# Patient Record
Sex: Female | Born: 1945 | Race: White | Hispanic: No | Smoking: Former smoker
Health system: Southern US, Community
[De-identification: ages and names within clinical notes are randomized; demographics above are authoritative.]

## PROBLEM LIST (undated history)

## (undated) DIAGNOSIS — T8182XA Emphysema (subcutaneous) resulting from a procedure, initial encounter: Secondary | ICD-10-CM

## (undated) DIAGNOSIS — I10 Essential (primary) hypertension: Secondary | ICD-10-CM

## (undated) DIAGNOSIS — F329 Major depressive disorder, single episode, unspecified: Secondary | ICD-10-CM

## (undated) DIAGNOSIS — J449 Chronic obstructive pulmonary disease, unspecified: Secondary | ICD-10-CM

## (undated) DIAGNOSIS — J984 Other disorders of lung: Secondary | ICD-10-CM

## (undated) DIAGNOSIS — J302 Other seasonal allergic rhinitis: Secondary | ICD-10-CM

## (undated) DIAGNOSIS — K219 Gastro-esophageal reflux disease without esophagitis: Secondary | ICD-10-CM

## (undated) DIAGNOSIS — F32A Depression, unspecified: Secondary | ICD-10-CM

## (undated) DIAGNOSIS — D649 Anemia, unspecified: Secondary | ICD-10-CM

## (undated) DIAGNOSIS — J45909 Unspecified asthma, uncomplicated: Secondary | ICD-10-CM

## (undated) DIAGNOSIS — C4490 Unspecified malignant neoplasm of skin, unspecified: Secondary | ICD-10-CM

## (undated) DIAGNOSIS — I482 Chronic atrial fibrillation, unspecified: Secondary | ICD-10-CM

## (undated) HISTORY — DX: Major depressive disorder, single episode, unspecified: F32.9

## (undated) HISTORY — DX: Other seasonal allergic rhinitis: J30.2

## (undated) HISTORY — DX: Depression, unspecified: F32.A

## (undated) HISTORY — DX: Unspecified malignant neoplasm of skin, unspecified: C44.90

## (undated) HISTORY — DX: Chronic atrial fibrillation, unspecified: I48.20

## (undated) HISTORY — DX: Chronic obstructive pulmonary disease, unspecified: J44.9

## (undated) HISTORY — PX: OTHER SURGICAL HISTORY: SHX169

## (undated) HISTORY — DX: Emphysema (subcutaneous) resulting from a procedure, initial encounter: T81.82XA

## (undated) HISTORY — DX: Other disorders of lung: J98.4

## (undated) HISTORY — DX: Anemia, unspecified: D64.9

## (undated) HISTORY — DX: Unspecified asthma, uncomplicated: J45.909

## (undated) HISTORY — PX: HYSTERECTOMY ABDOMINAL WITH SALPINGECTOMY: SHX6725

## (undated) HISTORY — DX: Essential (primary) hypertension: I10

## (undated) HISTORY — DX: Gastro-esophageal reflux disease without esophagitis: K21.9

---

## 2016-01-13 DIAGNOSIS — Z79891 Long term (current) use of opiate analgesic: Secondary | ICD-10-CM | POA: Diagnosis not present

## 2016-01-13 DIAGNOSIS — G894 Chronic pain syndrome: Secondary | ICD-10-CM | POA: Diagnosis not present

## 2016-01-13 DIAGNOSIS — E782 Mixed hyperlipidemia: Secondary | ICD-10-CM | POA: Diagnosis not present

## 2016-01-13 DIAGNOSIS — I4891 Unspecified atrial fibrillation: Secondary | ICD-10-CM | POA: Diagnosis not present

## 2016-01-13 DIAGNOSIS — I10 Essential (primary) hypertension: Secondary | ICD-10-CM | POA: Diagnosis not present

## 2016-01-13 DIAGNOSIS — J449 Chronic obstructive pulmonary disease, unspecified: Secondary | ICD-10-CM | POA: Diagnosis not present

## 2016-01-27 DIAGNOSIS — I4891 Unspecified atrial fibrillation: Secondary | ICD-10-CM | POA: Diagnosis not present

## 2016-01-27 DIAGNOSIS — J449 Chronic obstructive pulmonary disease, unspecified: Secondary | ICD-10-CM | POA: Diagnosis not present

## 2016-01-27 DIAGNOSIS — J984 Other disorders of lung: Secondary | ICD-10-CM | POA: Diagnosis not present

## 2016-01-27 DIAGNOSIS — Z87891 Personal history of nicotine dependence: Secondary | ICD-10-CM | POA: Diagnosis not present

## 2016-01-31 DIAGNOSIS — I10 Essential (primary) hypertension: Secondary | ICD-10-CM | POA: Diagnosis not present

## 2016-02-06 DIAGNOSIS — J449 Chronic obstructive pulmonary disease, unspecified: Secondary | ICD-10-CM | POA: Diagnosis not present

## 2016-02-06 DIAGNOSIS — R739 Hyperglycemia, unspecified: Secondary | ICD-10-CM | POA: Diagnosis not present

## 2016-02-06 DIAGNOSIS — E782 Mixed hyperlipidemia: Secondary | ICD-10-CM | POA: Diagnosis not present

## 2016-02-06 DIAGNOSIS — I10 Essential (primary) hypertension: Secondary | ICD-10-CM | POA: Diagnosis not present

## 2016-02-06 DIAGNOSIS — I4891 Unspecified atrial fibrillation: Secondary | ICD-10-CM | POA: Diagnosis not present

## 2016-02-06 DIAGNOSIS — R413 Other amnesia: Secondary | ICD-10-CM | POA: Diagnosis not present

## 2016-02-07 DIAGNOSIS — I4891 Unspecified atrial fibrillation: Secondary | ICD-10-CM | POA: Diagnosis not present

## 2016-02-21 DIAGNOSIS — I4891 Unspecified atrial fibrillation: Secondary | ICD-10-CM | POA: Diagnosis not present

## 2016-03-06 DIAGNOSIS — I4891 Unspecified atrial fibrillation: Secondary | ICD-10-CM | POA: Diagnosis not present

## 2016-03-19 DIAGNOSIS — I10 Essential (primary) hypertension: Secondary | ICD-10-CM | POA: Diagnosis not present

## 2016-03-19 DIAGNOSIS — G894 Chronic pain syndrome: Secondary | ICD-10-CM | POA: Diagnosis not present

## 2016-03-28 DIAGNOSIS — R109 Unspecified abdominal pain: Secondary | ICD-10-CM | POA: Diagnosis not present

## 2016-03-28 DIAGNOSIS — G894 Chronic pain syndrome: Secondary | ICD-10-CM | POA: Diagnosis not present

## 2016-03-30 DIAGNOSIS — I4891 Unspecified atrial fibrillation: Secondary | ICD-10-CM | POA: Diagnosis not present

## 2016-04-06 DIAGNOSIS — R4189 Other symptoms and signs involving cognitive functions and awareness: Secondary | ICD-10-CM | POA: Diagnosis not present

## 2016-04-13 DIAGNOSIS — I4891 Unspecified atrial fibrillation: Secondary | ICD-10-CM | POA: Diagnosis not present

## 2016-04-24 DIAGNOSIS — I4891 Unspecified atrial fibrillation: Secondary | ICD-10-CM | POA: Diagnosis not present

## 2016-04-28 DIAGNOSIS — K921 Melena: Secondary | ICD-10-CM | POA: Diagnosis not present

## 2016-05-01 DIAGNOSIS — R197 Diarrhea, unspecified: Secondary | ICD-10-CM | POA: Diagnosis not present

## 2016-05-02 DIAGNOSIS — K921 Melena: Secondary | ICD-10-CM | POA: Diagnosis not present

## 2016-05-07 DIAGNOSIS — E669 Obesity, unspecified: Secondary | ICD-10-CM | POA: Diagnosis not present

## 2016-05-07 DIAGNOSIS — Z6838 Body mass index (BMI) 38.0-38.9, adult: Secondary | ICD-10-CM | POA: Diagnosis not present

## 2016-05-07 DIAGNOSIS — G4733 Obstructive sleep apnea (adult) (pediatric): Secondary | ICD-10-CM | POA: Diagnosis not present

## 2016-05-07 DIAGNOSIS — J449 Chronic obstructive pulmonary disease, unspecified: Secondary | ICD-10-CM | POA: Diagnosis not present

## 2016-05-08 DIAGNOSIS — I4891 Unspecified atrial fibrillation: Secondary | ICD-10-CM | POA: Diagnosis not present

## 2016-05-21 DIAGNOSIS — G894 Chronic pain syndrome: Secondary | ICD-10-CM | POA: Diagnosis not present

## 2016-05-22 DIAGNOSIS — I4891 Unspecified atrial fibrillation: Secondary | ICD-10-CM | POA: Diagnosis not present

## 2016-06-05 DIAGNOSIS — I4891 Unspecified atrial fibrillation: Secondary | ICD-10-CM | POA: Diagnosis not present

## 2016-06-20 DIAGNOSIS — I10 Essential (primary) hypertension: Secondary | ICD-10-CM | POA: Diagnosis not present

## 2016-06-20 DIAGNOSIS — E782 Mixed hyperlipidemia: Secondary | ICD-10-CM | POA: Diagnosis not present

## 2016-06-20 DIAGNOSIS — M159 Polyosteoarthritis, unspecified: Secondary | ICD-10-CM | POA: Diagnosis not present

## 2016-06-20 DIAGNOSIS — I5032 Chronic diastolic (congestive) heart failure: Secondary | ICD-10-CM | POA: Diagnosis not present

## 2016-06-20 DIAGNOSIS — I4891 Unspecified atrial fibrillation: Secondary | ICD-10-CM | POA: Diagnosis not present

## 2016-06-20 DIAGNOSIS — F331 Major depressive disorder, recurrent, moderate: Secondary | ICD-10-CM | POA: Diagnosis not present

## 2016-06-20 DIAGNOSIS — J449 Chronic obstructive pulmonary disease, unspecified: Secondary | ICD-10-CM | POA: Diagnosis not present

## 2016-06-20 DIAGNOSIS — G4733 Obstructive sleep apnea (adult) (pediatric): Secondary | ICD-10-CM | POA: Diagnosis not present

## 2016-06-26 DIAGNOSIS — I4891 Unspecified atrial fibrillation: Secondary | ICD-10-CM | POA: Diagnosis not present

## 2016-07-24 DIAGNOSIS — I4891 Unspecified atrial fibrillation: Secondary | ICD-10-CM | POA: Diagnosis not present

## 2016-08-03 DIAGNOSIS — I4891 Unspecified atrial fibrillation: Secondary | ICD-10-CM | POA: Diagnosis not present

## 2016-08-17 DIAGNOSIS — I4891 Unspecified atrial fibrillation: Secondary | ICD-10-CM | POA: Diagnosis not present

## 2016-08-28 DIAGNOSIS — I4891 Unspecified atrial fibrillation: Secondary | ICD-10-CM | POA: Diagnosis not present

## 2016-09-11 DIAGNOSIS — I4891 Unspecified atrial fibrillation: Secondary | ICD-10-CM | POA: Diagnosis not present

## 2016-09-20 DIAGNOSIS — E782 Mixed hyperlipidemia: Secondary | ICD-10-CM | POA: Diagnosis not present

## 2016-09-20 DIAGNOSIS — I5032 Chronic diastolic (congestive) heart failure: Secondary | ICD-10-CM | POA: Diagnosis not present

## 2016-09-20 DIAGNOSIS — J449 Chronic obstructive pulmonary disease, unspecified: Secondary | ICD-10-CM | POA: Diagnosis not present

## 2016-09-20 DIAGNOSIS — I10 Essential (primary) hypertension: Secondary | ICD-10-CM | POA: Diagnosis not present

## 2016-10-02 DIAGNOSIS — I4891 Unspecified atrial fibrillation: Secondary | ICD-10-CM | POA: Diagnosis not present

## 2016-10-02 DIAGNOSIS — Z7901 Long term (current) use of anticoagulants: Secondary | ICD-10-CM | POA: Diagnosis not present

## 2016-10-10 DIAGNOSIS — J441 Chronic obstructive pulmonary disease with (acute) exacerbation: Secondary | ICD-10-CM | POA: Diagnosis not present

## 2016-11-02 DIAGNOSIS — I4891 Unspecified atrial fibrillation: Secondary | ICD-10-CM | POA: Diagnosis not present

## 2016-11-02 DIAGNOSIS — Z7901 Long term (current) use of anticoagulants: Secondary | ICD-10-CM | POA: Diagnosis not present

## 2016-11-09 DIAGNOSIS — Z7901 Long term (current) use of anticoagulants: Secondary | ICD-10-CM | POA: Diagnosis not present

## 2016-11-09 DIAGNOSIS — I4891 Unspecified atrial fibrillation: Secondary | ICD-10-CM | POA: Diagnosis not present

## 2016-11-09 DIAGNOSIS — J441 Chronic obstructive pulmonary disease with (acute) exacerbation: Secondary | ICD-10-CM | POA: Diagnosis not present

## 2016-11-09 DIAGNOSIS — I5032 Chronic diastolic (congestive) heart failure: Secondary | ICD-10-CM | POA: Diagnosis not present

## 2016-11-13 DIAGNOSIS — J441 Chronic obstructive pulmonary disease with (acute) exacerbation: Secondary | ICD-10-CM | POA: Diagnosis not present

## 2016-11-26 DIAGNOSIS — J441 Chronic obstructive pulmonary disease with (acute) exacerbation: Secondary | ICD-10-CM | POA: Diagnosis not present

## 2016-11-27 DIAGNOSIS — Z7901 Long term (current) use of anticoagulants: Secondary | ICD-10-CM | POA: Diagnosis not present

## 2016-11-27 DIAGNOSIS — I4891 Unspecified atrial fibrillation: Secondary | ICD-10-CM | POA: Diagnosis not present

## 2016-12-04 DIAGNOSIS — Z23 Encounter for immunization: Secondary | ICD-10-CM | POA: Diagnosis not present

## 2016-12-04 DIAGNOSIS — G4733 Obstructive sleep apnea (adult) (pediatric): Secondary | ICD-10-CM | POA: Diagnosis not present

## 2016-12-04 DIAGNOSIS — J449 Chronic obstructive pulmonary disease, unspecified: Secondary | ICD-10-CM | POA: Diagnosis not present

## 2016-12-04 DIAGNOSIS — I4891 Unspecified atrial fibrillation: Secondary | ICD-10-CM | POA: Diagnosis not present

## 2016-12-04 DIAGNOSIS — I5032 Chronic diastolic (congestive) heart failure: Secondary | ICD-10-CM | POA: Diagnosis not present

## 2016-12-07 DIAGNOSIS — I4891 Unspecified atrial fibrillation: Secondary | ICD-10-CM | POA: Diagnosis not present

## 2016-12-21 DIAGNOSIS — I4891 Unspecified atrial fibrillation: Secondary | ICD-10-CM | POA: Diagnosis not present

## 2017-01-09 DIAGNOSIS — I4891 Unspecified atrial fibrillation: Secondary | ICD-10-CM | POA: Diagnosis not present

## 2017-01-09 DIAGNOSIS — Z1231 Encounter for screening mammogram for malignant neoplasm of breast: Secondary | ICD-10-CM | POA: Diagnosis not present

## 2017-01-23 DIAGNOSIS — I4891 Unspecified atrial fibrillation: Secondary | ICD-10-CM | POA: Diagnosis not present

## 2017-01-30 DIAGNOSIS — J449 Chronic obstructive pulmonary disease, unspecified: Secondary | ICD-10-CM | POA: Diagnosis not present

## 2017-01-30 DIAGNOSIS — I10 Essential (primary) hypertension: Secondary | ICD-10-CM | POA: Diagnosis not present

## 2017-01-30 DIAGNOSIS — F329 Major depressive disorder, single episode, unspecified: Secondary | ICD-10-CM | POA: Diagnosis not present

## 2017-01-30 DIAGNOSIS — K219 Gastro-esophageal reflux disease without esophagitis: Secondary | ICD-10-CM | POA: Diagnosis not present

## 2017-01-30 DIAGNOSIS — G894 Chronic pain syndrome: Secondary | ICD-10-CM | POA: Diagnosis not present

## 2017-01-30 DIAGNOSIS — I482 Chronic atrial fibrillation: Secondary | ICD-10-CM | POA: Diagnosis not present

## 2017-01-30 DIAGNOSIS — J301 Allergic rhinitis due to pollen: Secondary | ICD-10-CM | POA: Diagnosis not present

## 2017-02-13 DIAGNOSIS — I4891 Unspecified atrial fibrillation: Secondary | ICD-10-CM | POA: Diagnosis not present

## 2017-02-22 ENCOUNTER — Ambulatory Visit (INDEPENDENT_AMBULATORY_CARE_PROVIDER_SITE_OTHER): Payer: Medicare Other | Admitting: Cardiovascular Disease

## 2017-02-22 ENCOUNTER — Encounter: Payer: Self-pay | Admitting: Cardiovascular Disease

## 2017-02-22 ENCOUNTER — Encounter: Payer: Self-pay | Admitting: *Deleted

## 2017-02-22 ENCOUNTER — Other Ambulatory Visit: Payer: Self-pay

## 2017-02-22 VITALS — BP 144/79 | HR 77 | Ht 61.5 in | Wt 209.0 lb

## 2017-02-22 DIAGNOSIS — I10 Essential (primary) hypertension: Secondary | ICD-10-CM | POA: Diagnosis not present

## 2017-02-22 DIAGNOSIS — Z7189 Other specified counseling: Secondary | ICD-10-CM

## 2017-02-22 DIAGNOSIS — R0602 Shortness of breath: Secondary | ICD-10-CM | POA: Diagnosis not present

## 2017-02-22 DIAGNOSIS — I4821 Permanent atrial fibrillation: Secondary | ICD-10-CM

## 2017-02-22 DIAGNOSIS — I509 Heart failure, unspecified: Secondary | ICD-10-CM | POA: Diagnosis not present

## 2017-02-22 DIAGNOSIS — I482 Chronic atrial fibrillation: Secondary | ICD-10-CM | POA: Diagnosis not present

## 2017-02-22 MED ORDER — METOPROLOL SUCCINATE ER 50 MG PO TB24: 50.0000 mg | ORAL_TABLET | Freq: Every day | ORAL | 6 refills | Status: DC

## 2017-02-22 MED ORDER — APIXABAN 5 MG PO TABS: 5.0000 mg | ORAL_TABLET | Freq: Two times a day (BID) | ORAL | 0 refills | Status: DC

## 2017-02-22 MED ORDER — METOPROLOL SUCCINATE ER 50 MG PO TB24: 50.0000 mg | ORAL_TABLET | Freq: Two times a day (BID) | ORAL | 6 refills | Status: DC

## 2017-02-22 MED ORDER — APIXABAN 5 MG PO TABS: 5.0000 mg | ORAL_TABLET | Freq: Two times a day (BID) | ORAL | 6 refills | Status: DC

## 2017-02-22 NOTE — Patient Instructions (Signed)
Medication Instructions:   Stop Sotalol.   Begin Toprol XL 50mg  twice a day.  Stop Warfarin.  Begin Eliquis 5mg  twice a day.    Continue all other medications.    Labwork: none  Testing/Procedures:  Your physician has requested that you have an echocardiogram. Echocardiography is a painless test that uses sound waves to create images of your heart. It provides your doctor with information about the size and shape of your heart and how well your heart's chambers and valves are working. This procedure takes approximately one hour. There are no restrictions for this procedure.  Office will contact with results via phone or letter.    Follow-Up: 1 month   Any Other Special Instructions Will Be Listed Below (If Applicable).  If you need a refill on your cardiac medications before your next appointment, please call your pharmacy.

## 2017-02-22 NOTE — Progress Notes (Addendum)
CARDIOLOGY CONSULT NOTE  Patient ID: Christina Clayton MRN: [id number] DOB/AGE: 1945-01-14 72 y.o.  Admit date: (Not on file) Primary Physician: Octavio Graves, DO Referring Physician: Dr. Melina Copa  Reason for Consultation: Atrial fibrillation  HPI: Christina Clayton is a 72 y.o. female who is being seen today for the evaluation of atrial fibrillation at the request of Octavio Graves, DO.   ECG performed today which I reviewed demonstrated atrial fibrillation, 75 bpm.  She is currently on sotalol and warfarin.  Additional past medical history includes COPD and hypertension.  She is here with her daughter whom she recently moved in with.  She was previously being seen by cardiologist, Dr. Shea Evans, in [place], [place].  She tells me she was diagnosed with atrial fibrillation over 20 years ago.  She said she has been on sotalol and warfarin for several years.  She denies palpitations and chest pain.  She has COPD and has had some more shortness of breath recently.  Her daughter tells me that her mother also has CHF.  She denies orthopnea, lightheadedness, dizziness, and paroxysmal nocturnal dyspnea.  She quit smoking about 30 years ago.   Allergies  Allergen Reactions  . Lisinopril     Current Outpatient Medications  Medication Sig Dispense Refill  . albuterol (PROVENTIL) (2.5 MG/3ML) 0.083% nebulizer solution Take 2.5 mg by nebulization every 6 (six) hours as needed for wheezing or shortness of breath.    . Albuterol Sulfate 108 (90 Base) MCG/ACT AEPB Inhale into the lungs.    Marland Kitchen amLODipine (NORVASC) 10 MG tablet Take 10 mg by mouth daily.    . Calcium Carb-Cholecalciferol (CALCIUM + D3) 600-200 MG-UNIT TABS Take by mouth.    . Fluticasone-Salmeterol (ADVAIR) 250-50 MCG/DOSE AEPB Inhale 1 puff into the lungs 2 (two) times daily.    . furosemide (LASIX) 40 MG tablet Take 40 mg by mouth.    . gabapentin (NEURONTIN) 600 MG tablet Take 600 mg by mouth 3 (three) times daily.      Marland Kitchen HYDROcodone-acetaminophen (NORCO/VICODIN) 5-325 MG tablet Take 1 tablet by mouth every 6 (six) hours as needed for moderate pain.    Marland Kitchen loratadine (CLARITIN) 10 MG tablet Take 10 mg by mouth daily.    Marland Kitchen losartan (COZAAR) 100 MG tablet Take 100 mg by mouth daily.    Marland Kitchen lovastatin (MEVACOR) 40 MG tablet Take 40 mg by mouth at bedtime.    . Magnesium 250 MG TABS Take by mouth.    . montelukast (SINGULAIR) 10 MG tablet Take 10 mg by mouth at bedtime.    . Multiple Vitamins-Minerals (PX CENTURY VITAMIN PO) Take by mouth.    . Omega-3 300 MG CAPS Take by mouth.    . pantoprazole (PROTONIX) 40 MG tablet Take 40 mg by mouth daily.    . sertraline (ZOLOFT) 100 MG tablet Take 100 mg by mouth daily.    Marland Kitchen apixaban (ELIQUIS) 5 MG TABS tablet Take 1 tablet (5 mg total) by mouth 2 (two) times daily. 60 tablet 6  . metoprolol succinate (TOPROL-XL) 50 MG 24 hr tablet Take 1 tablet (50 mg total) by mouth 2 (two) times daily. Take with or immediately following a meal. 60 tablet 6   No current facility-administered medications for this visit.     Past Medical History:  Diagnosis Date  . Acid reflux   . Anemia   . Asthma   . Chronic atrial fibrillation ([place])   . Chronic lung disease   .  COPD (chronic obstructive pulmonary disease) ([place])   . Depression   . Emphysema (subcutaneous) (surgical) resulting from a procedure   . Hypertension   . Seasonal allergic rhinitis   . Skin cancer     Past Surgical History:  Procedure Laterality Date  . cataracts    . HYSTERECTOMY ABDOMINAL WITH SALPINGECTOMY      Social History   Socioeconomic History  . Marital status: Divorced    Spouse name: Not on file  . Number of children: Not on file  . Years of education: Not on file  . Highest education level: Not on file  Social Needs  . Financial resource strain: Not on file  . Food insecurity - worry: Not on file  . Food insecurity - inability: Not on file  . Transportation needs - medical: Not on file  .  Transportation needs - non-medical: Not on file  Occupational History  . Not on file  Tobacco Use  . Smoking status: Former Research scientist (life sciences)  . Smokeless tobacco: Never Used  Substance and Sexual Activity  . Alcohol use: Not on file  . Drug use: Not on file  . Sexual activity: Not on file  Other Topics Concern  . Not on file  Social History Narrative  . Not on file     No family history of premature CAD in 1st degree relatives.  Current Meds  Medication Sig  . albuterol (PROVENTIL) (2.5 MG/3ML) 0.083% nebulizer solution Take 2.5 mg by nebulization every 6 (six) hours as needed for wheezing or shortness of breath.  . Albuterol Sulfate 108 (90 Base) MCG/ACT AEPB Inhale into the lungs.  Marland Kitchen amLODipine (NORVASC) 10 MG tablet Take 10 mg by mouth daily.  . Calcium Carb-Cholecalciferol (CALCIUM + D3) 600-200 MG-UNIT TABS Take by mouth.  . Fluticasone-Salmeterol (ADVAIR) 250-50 MCG/DOSE AEPB Inhale 1 puff into the lungs 2 (two) times daily.  . furosemide (LASIX) 40 MG tablet Take 40 mg by mouth.  . gabapentin (NEURONTIN) 600 MG tablet Take 600 mg by mouth 3 (three) times daily.  Marland Kitchen HYDROcodone-acetaminophen (NORCO/VICODIN) 5-325 MG tablet Take 1 tablet by mouth every 6 (six) hours as needed for moderate pain.  Marland Kitchen loratadine (CLARITIN) 10 MG tablet Take 10 mg by mouth daily.  Marland Kitchen losartan (COZAAR) 100 MG tablet Take 100 mg by mouth daily.  Marland Kitchen lovastatin (MEVACOR) 40 MG tablet Take 40 mg by mouth at bedtime.  . Magnesium 250 MG TABS Take by mouth.  . montelukast (SINGULAIR) 10 MG tablet Take 10 mg by mouth at bedtime.  . Multiple Vitamins-Minerals (PX CENTURY VITAMIN PO) Take by mouth.  . Omega-3 300 MG CAPS Take by mouth.  . pantoprazole (PROTONIX) 40 MG tablet Take 40 mg by mouth daily.  . sertraline (ZOLOFT) 100 MG tablet Take 100 mg by mouth daily.  . [DISCONTINUED] sotalol (BETAPACE) 120 MG tablet Take 120 mg by mouth 2 (two) times daily.  . [DISCONTINUED] warfarin (COUMADIN) 2.5 MG tablet Take 2.5  mg by mouth daily.      Review of systems complete and found to be negative unless listed above in HPI    Physical exam Blood pressure (!) 144/79, pulse 77, height 5' 1.5" (1.562 m), weight 209 lb (94.8 kg), SpO2 94 %. General: NAD Neck: No JVD, no thyromegaly or thyroid nodule.  Lungs: Scattered expiratory wheezes, no crackles. CV: Nondisplaced PMI. Regular rate and regular rhythm, normal S1/S2, no S3, no murmur.  Trace bilateral lower extremity edema.  No carotid bruit.    Abdomen:  Soft, nontender, obese. Skin: Intact without lesions or rashes.  Neurologic: Alert and oriented x 3.  Psych: Normal affect. Extremities: No clubbing or cyanosis.  HEENT: Normal.   ECG: Most recent ECG reviewed.   Labs: No results found for: K, BUN, CREATININE, ALT, TSH, HGB   Lipids: No results found for: LDLCALC, LDLDIRECT, CHOL, TRIG, HDL      ASSESSMENT AND PLAN:  1.  Permanent atrial fibrillation: She is currently asymptomatic from the standpoint.  I will obtain records from [place], [place].  I will obtain a copy of her labs from her PCP.  I will obtain an echocardiogram to evaluate cardiac structure and function.  I will discontinue sotalol she remains in atrial fibrillation.  I will start Toprol-XL 50 mg twice daily for heart rate control.  I will discontinue warfarin as I would prefer to administer systemic anticoagulation with apixaban 5 mg twice daily.  2.  Chronic CHF: Unclear if this is systolic or diastolic in etiology.  She denies a history of MI.  I will obtain an echocardiogram to evaluate cardiac structure and function.  She appears to be relatively euvolemic on Lasix 40 mg daily.  I am switching sotalol to Toprol-XL 50 mg twice daily.  She is already on losartan 100 mg daily.  3.  Hypertension: Blood pressure is mildly elevated today.  She is on amlodipine and losartan.  I will monitor.  4.  Shortness of breath: This may be due to COPD as she has appreciable wheezes on exam.  She  uses inhalers and a nebulizer at home.    Disposition: Follow up in 1 month  Signed: Kate Sable, M.D., F.A.C.C.  02/22/2017, 2:31 PM    Addendum:  I reviewed records from her cardiologist in [place], [place].  She has a history of PSVT status post ablation.  She underwent TEE/DCCV successfully in June 2012.  An echocardiogram in April 2014 demonstrated normal left ventricular systolic function, LVEF 94-17%, with moderate left atrial dilatation and mild aortic stenosis.

## 2017-03-06 ENCOUNTER — Other Ambulatory Visit: Payer: Self-pay

## 2017-03-06 ENCOUNTER — Ambulatory Visit (INDEPENDENT_AMBULATORY_CARE_PROVIDER_SITE_OTHER): Payer: Medicare Other

## 2017-03-06 DIAGNOSIS — I482 Chronic atrial fibrillation: Secondary | ICD-10-CM | POA: Diagnosis not present

## 2017-03-06 DIAGNOSIS — I4821 Permanent atrial fibrillation: Secondary | ICD-10-CM

## 2017-03-07 ENCOUNTER — Telehealth: Payer: Self-pay | Admitting: *Deleted

## 2017-03-07 NOTE — Telephone Encounter (Signed)
Notes recorded by Laurine Blazer, LPN on 03/05/5972 at 1:63 PM EST Patient notified. Copy to pmd. Follow up scheduled for 04/01/17 with Dr. Bronson Ing.   ------  Notes recorded by Herminio Commons, MD on 03/07/2017 at 12:00 PM EST Normal pumping function. Moderate aortic valve narrowing. I will repeat an echo in 1 year or more.

## 2017-03-11 DIAGNOSIS — J449 Chronic obstructive pulmonary disease, unspecified: Secondary | ICD-10-CM | POA: Diagnosis not present

## 2017-03-11 DIAGNOSIS — I10 Essential (primary) hypertension: Secondary | ICD-10-CM | POA: Diagnosis not present

## 2017-03-11 DIAGNOSIS — I482 Chronic atrial fibrillation: Secondary | ICD-10-CM | POA: Diagnosis not present

## 2017-03-11 DIAGNOSIS — J9611 Chronic respiratory failure with hypoxia: Secondary | ICD-10-CM | POA: Diagnosis not present

## 2017-03-12 DIAGNOSIS — J449 Chronic obstructive pulmonary disease, unspecified: Secondary | ICD-10-CM | POA: Diagnosis not present

## 2017-03-12 DIAGNOSIS — I482 Chronic atrial fibrillation: Secondary | ICD-10-CM | POA: Diagnosis not present

## 2017-03-12 DIAGNOSIS — G894 Chronic pain syndrome: Secondary | ICD-10-CM | POA: Diagnosis not present

## 2017-03-12 DIAGNOSIS — K219 Gastro-esophageal reflux disease without esophagitis: Secondary | ICD-10-CM | POA: Diagnosis not present

## 2017-03-12 DIAGNOSIS — J301 Allergic rhinitis due to pollen: Secondary | ICD-10-CM | POA: Diagnosis not present

## 2017-03-12 DIAGNOSIS — G8929 Other chronic pain: Secondary | ICD-10-CM | POA: Diagnosis not present

## 2017-03-12 DIAGNOSIS — I10 Essential (primary) hypertension: Secondary | ICD-10-CM | POA: Diagnosis not present

## 2017-03-12 DIAGNOSIS — F329 Major depressive disorder, single episode, unspecified: Secondary | ICD-10-CM | POA: Diagnosis not present

## 2017-03-22 ENCOUNTER — Telehealth: Payer: Self-pay | Admitting: Cardiovascular Disease

## 2017-03-22 NOTE — Telephone Encounter (Signed)
Advised daughter 6 refills were sent to the pharmacy in feb. [place] and they stated rx was ready for pick up

## 2017-03-22 NOTE — Telephone Encounter (Signed)
Christina Clayton -daughter - called stating that the RX metoprolol succinate (TOPROL-XL) 50 MG 24   needs to be called in stating that patient is supposed to be taking this twice daily.  Please call [place], [place]

## 2017-04-01 ENCOUNTER — Other Ambulatory Visit: Payer: Self-pay

## 2017-04-01 ENCOUNTER — Encounter: Payer: Self-pay | Admitting: Cardiovascular Disease

## 2017-04-01 ENCOUNTER — Ambulatory Visit (INDEPENDENT_AMBULATORY_CARE_PROVIDER_SITE_OTHER): Payer: Medicare Other | Admitting: Cardiovascular Disease

## 2017-04-01 VITALS — BP 143/83 | HR 96 | Ht 61.0 in | Wt 211.0 lb

## 2017-04-01 DIAGNOSIS — I10 Essential (primary) hypertension: Secondary | ICD-10-CM | POA: Diagnosis not present

## 2017-04-01 DIAGNOSIS — I5032 Chronic diastolic (congestive) heart failure: Secondary | ICD-10-CM | POA: Diagnosis not present

## 2017-04-01 DIAGNOSIS — I4821 Permanent atrial fibrillation: Secondary | ICD-10-CM

## 2017-04-01 DIAGNOSIS — I482 Chronic atrial fibrillation: Secondary | ICD-10-CM

## 2017-04-01 DIAGNOSIS — I350 Nonrheumatic aortic (valve) stenosis: Secondary | ICD-10-CM

## 2017-04-01 DIAGNOSIS — R0602 Shortness of breath: Secondary | ICD-10-CM | POA: Diagnosis not present

## 2017-04-01 NOTE — Patient Instructions (Signed)

## 2017-04-01 NOTE — Progress Notes (Signed)
SUBJECTIVE: The patient presents for follow-up of atrial fibrillation and CHF.  Echocardiogram on 03/06/17 demonstrated normal left ventricular systolic function, LVEF 92-11%, moderate aortic stenosis, and mild left atrial dilatation.  She denies chest pain and palpitations.  She has mild leg swelling.  She has been bothered by seasonal allergies with wheezing.  Review of Systems: As per "subjective", otherwise negative.  Allergies  Allergen Reactions  . Lisinopril     Current Outpatient Medications  Medication Sig Dispense Refill  . albuterol (PROVENTIL) (2.5 MG/3ML) 0.083% nebulizer solution Take 2.5 mg by nebulization every 6 (six) hours as needed for wheezing or shortness of breath.    . Albuterol Sulfate 108 (90 Base) MCG/ACT AEPB Inhale into the lungs.    Marland Kitchen amLODipine (NORVASC) 10 MG tablet Take 10 mg by mouth daily.    Marland Kitchen apixaban (ELIQUIS) 5 MG TABS tablet Take 1 tablet (5 mg total) by mouth 2 (two) times daily. 60 tablet 6  . Calcium Carb-Cholecalciferol (CALCIUM + D3) 600-200 MG-UNIT TABS Take by mouth.    . Fluticasone-Umeclidin-Vilant (TRELEGY ELLIPTA) 100-62.5-25 MCG/INH AEPB Inhale into the lungs.    . furosemide (LASIX) 40 MG tablet Take 40 mg by mouth.    . gabapentin (NEURONTIN) 600 MG tablet Take 600 mg by mouth 3 (three) times daily.    Marland Kitchen HYDROcodone-acetaminophen (NORCO/VICODIN) 5-325 MG tablet Take 1 tablet by mouth every 6 (six) hours as needed for moderate pain.    Marland Kitchen loratadine (CLARITIN) 10 MG tablet Take 10 mg by mouth daily.    Marland Kitchen losartan (COZAAR) 100 MG tablet Take 100 mg by mouth daily.    Marland Kitchen lovastatin (MEVACOR) 40 MG tablet Take 40 mg by mouth at bedtime.    . Magnesium 250 MG TABS Take by mouth.    . metoprolol succinate (TOPROL-XL) 50 MG 24 hr tablet Take 1 tablet (50 mg total) by mouth 2 (two) times daily. Take with or immediately following a meal. 60 tablet 6  . montelukast (SINGULAIR) 10 MG tablet Take 10 mg by mouth at bedtime.    . Multiple  Vitamins-Minerals (PX CENTURY VITAMIN PO) Take by mouth.    . Omega-3 300 MG CAPS Take by mouth.    . pantoprazole (PROTONIX) 40 MG tablet Take 40 mg by mouth daily.    . sertraline (ZOLOFT) 100 MG tablet Take 100 mg by mouth daily.     No current facility-administered medications for this visit.     Past Medical History:  Diagnosis Date  . Acid reflux   . Anemia   . Asthma   . Chronic atrial fibrillation ([place])   . Chronic lung disease   . COPD (chronic obstructive pulmonary disease) ([place])   . Depression   . Emphysema (subcutaneous) (surgical) resulting from a procedure   . Hypertension   . Seasonal allergic rhinitis   . Skin cancer     Past Surgical History:  Procedure Laterality Date  . cataracts    . HYSTERECTOMY ABDOMINAL WITH SALPINGECTOMY      Social History   Socioeconomic History  . Marital status: Divorced    Spouse name: Not on file  . Number of children: Not on file  . Years of education: Not on file  . Highest education level: Not on file  Occupational History  . Not on file  Social Needs  . Financial resource strain: Not on file  . Food insecurity:    Worry: Not on file    Inability: Not on  file  . Transportation needs:    Medical: Not on file    Non-medical: Not on file  Tobacco Use  . Smoking status: Former Research scientist (life sciences)  . Smokeless tobacco: Never Used  Substance and Sexual Activity  . Alcohol use: Not on file  . Drug use: Not on file  . Sexual activity: Not on file  Lifestyle  . Physical activity:    Days per week: Not on file    Minutes per session: Not on file  . Stress: Not on file  Relationships  . Social connections:    Talks on phone: Not on file    Gets together: Not on file    Attends religious service: Not on file    Active member of club or organization: Not on file    Attends meetings of clubs or organizations: Not on file    Relationship status: Not on file  . Intimate partner violence:    Fear of current or ex partner: Not on  file    Emotionally abused: Not on file    Physically abused: Not on file    Forced sexual activity: Not on file  Other Topics Concern  . Not on file  Social History Narrative  . Not on file     Vitals:   04/01/17 1255  BP: (!) 143/83  Pulse: 96  SpO2: 97%  Weight: 211 lb (95.7 kg)  Height: 5\' 1"  (1.549 m)    Wt Readings from Last 3 Encounters:  04/01/17 211 lb (95.7 kg)  02/22/17 209 lb (94.8 kg)     PHYSICAL EXAM General: NAD HEENT: Normal. Neck: No JVD, no thyromegaly. Lungs: Bilateral expiratory wheezes, no crackles.   CV: Regular rate and irregular rhythm, normal S1/S2, no S3, no murmur. No pretibial or periankle edema.  No carotid bruit.   Abdomen: Soft, nontender, no distention.  Neurologic: Alert and oriented.  Psych: Normal affect. Skin: Normal. Musculoskeletal: No gross deformities.    ECG: Most recent ECG reviewed.   Labs: No results found for: K, BUN, CREATININE, ALT, TSH, HGB   Lipids: No results found for: LDLCALC, LDLDIRECT, CHOL, TRIG, HDL     ASSESSMENT AND PLAN:  1.  Permanent atrial fibrillation: She is currently asymptomatic from the standpoint.  Continue Toprol-XL 50 mg twice daily for heart rate control.  I will continue systemic anticoagulation with apixaban 5 mg twice daily.  2.  Chronic diastolic heart failure: She appears to be relatively euvolemic on Lasix 40 mg daily.  Continue Toprol-XL and losartan.  I would recommend optimal control of blood pressure.  3.  Hypertension: Blood pressure is mildly elevated today.  She is on amlodipine and losartan.  I would consider adding hydralazine 25 mg twice daily.  I told her to keep her blood pressure monitored.  4.  Shortness of breath: This may be due to COPD as she has appreciable wheezes on exam.  She uses inhalers and a nebulizer at home.  5.  Moderate aortic stenosis: Symptomatically stable.  I will monitor with clinical and routine echocardiographic surveillance.   Disposition:  Follow up 6 months   Kate Sable, M.D., F.A.C.C.

## 2017-04-24 DIAGNOSIS — J301 Allergic rhinitis due to pollen: Secondary | ICD-10-CM | POA: Diagnosis not present

## 2017-04-24 DIAGNOSIS — I482 Chronic atrial fibrillation: Secondary | ICD-10-CM | POA: Diagnosis not present

## 2017-04-24 DIAGNOSIS — J449 Chronic obstructive pulmonary disease, unspecified: Secondary | ICD-10-CM | POA: Diagnosis not present

## 2017-04-24 DIAGNOSIS — I10 Essential (primary) hypertension: Secondary | ICD-10-CM | POA: Diagnosis not present

## 2017-06-07 DIAGNOSIS — R1013 Epigastric pain: Secondary | ICD-10-CM | POA: Diagnosis not present

## 2017-06-17 DIAGNOSIS — R1013 Epigastric pain: Secondary | ICD-10-CM | POA: Diagnosis not present

## 2017-06-17 DIAGNOSIS — J449 Chronic obstructive pulmonary disease, unspecified: Secondary | ICD-10-CM | POA: Diagnosis not present

## 2017-06-17 DIAGNOSIS — J301 Allergic rhinitis due to pollen: Secondary | ICD-10-CM | POA: Diagnosis not present

## 2017-06-17 DIAGNOSIS — I482 Chronic atrial fibrillation: Secondary | ICD-10-CM | POA: Diagnosis not present

## 2017-06-17 DIAGNOSIS — G894 Chronic pain syndrome: Secondary | ICD-10-CM | POA: Diagnosis not present

## 2017-06-17 DIAGNOSIS — K219 Gastro-esophageal reflux disease without esophagitis: Secondary | ICD-10-CM | POA: Diagnosis not present

## 2017-06-17 DIAGNOSIS — I10 Essential (primary) hypertension: Secondary | ICD-10-CM | POA: Diagnosis not present

## 2017-06-17 DIAGNOSIS — F329 Major depressive disorder, single episode, unspecified: Secondary | ICD-10-CM | POA: Diagnosis not present

## 2017-06-17 DIAGNOSIS — G8929 Other chronic pain: Secondary | ICD-10-CM | POA: Diagnosis not present

## 2017-06-24 ENCOUNTER — Other Ambulatory Visit (HOSPITAL_COMMUNITY): Payer: Self-pay | Admitting: *Deleted

## 2017-06-24 DIAGNOSIS — R1013 Epigastric pain: Secondary | ICD-10-CM

## 2017-06-27 ENCOUNTER — Ambulatory Visit (HOSPITAL_COMMUNITY): Payer: Medicare Other

## 2017-06-28 ENCOUNTER — Ambulatory Visit (HOSPITAL_COMMUNITY)
Admission: RE | Admit: 2017-06-28 | Discharge: 2017-06-28 | Disposition: A | Discharge status: Home / Self Care | Payer: Medicare Other | Source: Ambulatory Visit | Attending: *Deleted | Admitting: *Deleted

## 2017-06-28 DIAGNOSIS — R1013 Epigastric pain: Secondary | ICD-10-CM | POA: Insufficient documentation

## 2017-07-23 DIAGNOSIS — I482 Chronic atrial fibrillation: Secondary | ICD-10-CM | POA: Diagnosis not present

## 2017-07-23 DIAGNOSIS — J449 Chronic obstructive pulmonary disease, unspecified: Secondary | ICD-10-CM | POA: Diagnosis not present

## 2017-07-23 DIAGNOSIS — I10 Essential (primary) hypertension: Secondary | ICD-10-CM | POA: Diagnosis not present

## 2017-07-23 DIAGNOSIS — E669 Obesity, unspecified: Secondary | ICD-10-CM | POA: Diagnosis not present

## 2017-09-17 DIAGNOSIS — E559 Vitamin D deficiency, unspecified: Secondary | ICD-10-CM | POA: Diagnosis not present

## 2017-09-17 DIAGNOSIS — G8929 Other chronic pain: Secondary | ICD-10-CM | POA: Diagnosis not present

## 2017-09-17 DIAGNOSIS — F329 Major depressive disorder, single episode, unspecified: Secondary | ICD-10-CM | POA: Diagnosis not present

## 2017-09-17 DIAGNOSIS — J449 Chronic obstructive pulmonary disease, unspecified: Secondary | ICD-10-CM | POA: Diagnosis not present

## 2017-09-17 DIAGNOSIS — J301 Allergic rhinitis due to pollen: Secondary | ICD-10-CM | POA: Diagnosis not present

## 2017-09-17 DIAGNOSIS — I10 Essential (primary) hypertension: Secondary | ICD-10-CM | POA: Diagnosis not present

## 2017-09-17 DIAGNOSIS — K219 Gastro-esophageal reflux disease without esophagitis: Secondary | ICD-10-CM | POA: Diagnosis not present

## 2017-09-17 DIAGNOSIS — I482 Chronic atrial fibrillation: Secondary | ICD-10-CM | POA: Diagnosis not present

## 2017-09-17 DIAGNOSIS — G894 Chronic pain syndrome: Secondary | ICD-10-CM | POA: Diagnosis not present

## 2017-09-17 DIAGNOSIS — G2581 Restless legs syndrome: Secondary | ICD-10-CM | POA: Diagnosis not present

## 2017-10-08 ENCOUNTER — Ambulatory Visit (INDEPENDENT_AMBULATORY_CARE_PROVIDER_SITE_OTHER): Payer: Medicare Other | Admitting: Cardiovascular Disease

## 2017-10-08 ENCOUNTER — Encounter: Payer: Self-pay | Admitting: Cardiovascular Disease

## 2017-10-08 VITALS — BP 113/77 | HR 76 | Ht 61.0 in | Wt 216.2 lb

## 2017-10-08 DIAGNOSIS — I350 Nonrheumatic aortic (valve) stenosis: Secondary | ICD-10-CM | POA: Diagnosis not present

## 2017-10-08 DIAGNOSIS — Z23 Encounter for immunization: Secondary | ICD-10-CM

## 2017-10-08 DIAGNOSIS — I5032 Chronic diastolic (congestive) heart failure: Secondary | ICD-10-CM | POA: Diagnosis not present

## 2017-10-08 DIAGNOSIS — I10 Essential (primary) hypertension: Secondary | ICD-10-CM

## 2017-10-08 DIAGNOSIS — I4821 Permanent atrial fibrillation: Secondary | ICD-10-CM | POA: Diagnosis not present

## 2017-10-08 NOTE — Patient Instructions (Signed)

## 2017-10-08 NOTE — Progress Notes (Signed)
SUBJECTIVE: The patient presents for follow-up of atrial fibrillation, aortic stenosis, and chronic diastolic heart failure.  Echocardiogram on 03/06/17 demonstrated normal left ventricular systolic function, LVEF 65-03%, moderate aortic stenosis, and mild left atrial dilatation.  She is without complaints today.  COPD appears to be stable.  She denies chest pain and palpitations.  Leg edema is controlled with current dose of Lasix.  She denies lightheadedness and dizziness.  She plans to go on the [facility] to see the fall colors in the near future.    Review of Systems: As per "subjective", otherwise negative.  Allergies  Allergen Reactions  . Lisinopril     Current Outpatient Medications  Medication Sig Dispense Refill  . albuterol (PROVENTIL) (2.5 MG/3ML) 0.083% nebulizer solution Take 2.5 mg by nebulization every 6 (six) hours as needed for wheezing or shortness of breath.    . Albuterol Sulfate 108 (90 Base) MCG/ACT AEPB Inhale into the lungs.    Marland Kitchen amLODipine (NORVASC) 10 MG tablet Take 10 mg by mouth daily.    Marland Kitchen apixaban (ELIQUIS) 5 MG TABS tablet Take 1 tablet (5 mg total) by mouth 2 (two) times daily. 60 tablet 6  . Calcium Carb-Cholecalciferol (CALCIUM + D3) 600-200 MG-UNIT TABS Take by mouth.    . Fluticasone-Umeclidin-Vilant (TRELEGY ELLIPTA) 100-62.5-25 MCG/INH AEPB Inhale into the lungs.    . furosemide (LASIX) 40 MG tablet Take 40 mg by mouth.    . gabapentin (NEURONTIN) 600 MG tablet Take 600 mg by mouth 3 (three) times daily.    Marland Kitchen HYDROcodone-acetaminophen (NORCO/VICODIN) 5-325 MG tablet Take 1 tablet by mouth every 6 (six) hours as needed for moderate pain.    Marland Kitchen loratadine (CLARITIN) 10 MG tablet Take 10 mg by mouth daily.    Marland Kitchen losartan (COZAAR) 100 MG tablet Take 100 mg by mouth daily.    Marland Kitchen lovastatin (MEVACOR) 40 MG tablet Take 40 mg by mouth at bedtime.    . Magnesium 250 MG TABS Take by mouth.    . metoprolol succinate (TOPROL-XL) 50 MG 24 hr  tablet Take 1 tablet (50 mg total) by mouth 2 (two) times daily. Take with or immediately following a meal. 60 tablet 6  . montelukast (SINGULAIR) 10 MG tablet Take 10 mg by mouth at bedtime.    . Multiple Vitamins-Minerals (PX CENTURY VITAMIN PO) Take by mouth.    . Omega-3 300 MG CAPS Take by mouth.    . pantoprazole (PROTONIX) 40 MG tablet Take 40 mg by mouth daily.    . sertraline (ZOLOFT) 100 MG tablet Take 100 mg by mouth daily.     No current facility-administered medications for this visit.     Past Medical History:  Diagnosis Date  . Acid reflux   . Anemia   . Asthma   . Chronic atrial fibrillation   . Chronic lung disease   . COPD (chronic obstructive pulmonary disease) ([place])   . Depression   . Emphysema (subcutaneous) (surgical) resulting from a procedure   . Hypertension   . Seasonal allergic rhinitis   . Skin cancer     Past Surgical History:  Procedure Laterality Date  . cataracts    . HYSTERECTOMY ABDOMINAL WITH SALPINGECTOMY      Social History   Socioeconomic History  . Marital status: Divorced    Spouse name: Not on file  . Number of children: Not on file  . Years of education: Not on file  . Highest education level: Not on  file  Occupational History  . Not on file  Social Needs  . Financial resource strain: Not on file  . Food insecurity:    Worry: Not on file    Inability: Not on file  . Transportation needs:    Medical: Not on file    Non-medical: Not on file  Tobacco Use  . Smoking status: Former Research scientist (life sciences)  . Smokeless tobacco: Never Used  Substance and Sexual Activity  . Alcohol use: Not on file  . Drug use: Not on file  . Sexual activity: Not on file  Lifestyle  . Physical activity:    Days per week: Not on file    Minutes per session: Not on file  . Stress: Not on file  Relationships  . Social connections:    Talks on phone: Not on file    Gets together: Not on file    Attends religious service: Not on file    Active member of  club or organization: Not on file    Attends meetings of clubs or organizations: Not on file    Relationship status: Not on file  . Intimate partner violence:    Fear of current or ex partner: Not on file    Emotionally abused: Not on file    Physically abused: Not on file    Forced sexual activity: Not on file  Other Topics Concern  . Not on file  Social History Narrative  . Not on file     Vitals:   10/08/17 1046  BP: 113/77  Pulse: 76  SpO2: 96%  Weight: 216 lb 3.2 oz (98.1 kg)  Height: 5\' 1"  (1.549 m)    Wt Readings from Last 3 Encounters:  10/08/17 216 lb 3.2 oz (98.1 kg)  04/01/17 211 lb (95.7 kg)  02/22/17 209 lb (94.8 kg)     PHYSICAL EXAM General: NAD HEENT: Normal. Neck: No JVD, no thyromegaly. Lungs: Faint end-expiratory wheezes bilaterally, no crackles. CV: Regular rate and irregular rhythm, normal S1/S2, no S3, no murmur. No pretibial or periankle edema.  No carotid bruit.   Abdomen: Soft, nontender, no distention.  Neurologic: Alert and oriented.  Psych: Normal affect. Skin: Normal. Musculoskeletal: No gross deformities.    ECG: Reviewed above under Subjective   Labs: No results found for: K, BUN, CREATININE, ALT, TSH, HGB   Lipids: No results found for: LDLCALC, LDLDIRECT, CHOL, TRIG, HDL     ASSESSMENT AND PLAN:  1. Permanent atrial fibrillation: She is currently asymptomatic from this standpoint.  Continue Toprol-XL 50 mg twice daily for heart rate control. I will continue systemic anticoagulation with apixaban 5 mg twice daily.  2. Chronic diastolic heart failure: She appears to be relatively euvolemic on Lasix 40 mg daily.  Continue Toprol-XL and losartan.  3. Hypertension: Blood pressure is normal. She is on amlodipine, Toprol-XL, and losartan.  No changes to therapy today.  4.  Moderate aortic stenosis: Symptomatically stable.  I will monitor with clinical and routine echocardiographic surveillance.    Disposition: Follow  up 6 months   Kate Sable, M.D., F.A.C.C.

## 2017-10-30 ENCOUNTER — Other Ambulatory Visit: Payer: Self-pay | Admitting: Cardiovascular Disease

## 2017-12-18 DIAGNOSIS — M719 Bursopathy, unspecified: Secondary | ICD-10-CM | POA: Diagnosis not present

## 2017-12-18 DIAGNOSIS — Z0000 Encounter for general adult medical examination without abnormal findings: Secondary | ICD-10-CM | POA: Diagnosis not present

## 2017-12-18 DIAGNOSIS — G894 Chronic pain syndrome: Secondary | ICD-10-CM | POA: Diagnosis not present

## 2017-12-18 DIAGNOSIS — I10 Essential (primary) hypertension: Secondary | ICD-10-CM | POA: Diagnosis not present

## 2017-12-18 DIAGNOSIS — E559 Vitamin D deficiency, unspecified: Secondary | ICD-10-CM | POA: Diagnosis not present

## 2018-01-08 ENCOUNTER — Telehealth: Payer: Self-pay | Admitting: Cardiovascular Disease

## 2018-01-08 DIAGNOSIS — I5032 Chronic diastolic (congestive) heart failure: Secondary | ICD-10-CM

## 2018-01-08 NOTE — Telephone Encounter (Signed)
°  Precert needed for:  Echo   Location: [facility]     Date: March 12, 2018     Edd Fabian need order in please

## 2018-03-12 ENCOUNTER — Ambulatory Visit (INDEPENDENT_AMBULATORY_CARE_PROVIDER_SITE_OTHER): Payer: Medicare Other

## 2018-03-12 ENCOUNTER — Other Ambulatory Visit: Payer: Self-pay

## 2018-03-12 DIAGNOSIS — I5032 Chronic diastolic (congestive) heart failure: Secondary | ICD-10-CM | POA: Diagnosis not present

## 2018-03-13 DIAGNOSIS — I482 Chronic atrial fibrillation, unspecified: Secondary | ICD-10-CM | POA: Diagnosis not present

## 2018-03-13 DIAGNOSIS — J449 Chronic obstructive pulmonary disease, unspecified: Secondary | ICD-10-CM | POA: Diagnosis not present

## 2018-03-13 DIAGNOSIS — J9611 Chronic respiratory failure with hypoxia: Secondary | ICD-10-CM | POA: Diagnosis not present

## 2018-03-13 DIAGNOSIS — I5032 Chronic diastolic (congestive) heart failure: Secondary | ICD-10-CM | POA: Diagnosis not present

## 2018-03-19 DIAGNOSIS — E559 Vitamin D deficiency, unspecified: Secondary | ICD-10-CM | POA: Diagnosis not present

## 2018-03-19 DIAGNOSIS — G8929 Other chronic pain: Secondary | ICD-10-CM | POA: Diagnosis not present

## 2018-03-19 DIAGNOSIS — G2581 Restless legs syndrome: Secondary | ICD-10-CM | POA: Diagnosis not present

## 2018-03-19 DIAGNOSIS — J301 Allergic rhinitis due to pollen: Secondary | ICD-10-CM | POA: Diagnosis not present

## 2018-03-19 DIAGNOSIS — I10 Essential (primary) hypertension: Secondary | ICD-10-CM | POA: Diagnosis not present

## 2018-03-19 DIAGNOSIS — F329 Major depressive disorder, single episode, unspecified: Secondary | ICD-10-CM | POA: Diagnosis not present

## 2018-03-19 DIAGNOSIS — J449 Chronic obstructive pulmonary disease, unspecified: Secondary | ICD-10-CM | POA: Diagnosis not present

## 2018-03-31 ENCOUNTER — Other Ambulatory Visit: Payer: Self-pay

## 2018-03-31 MED ORDER — METOPROLOL SUCCINATE ER 50 MG PO TB24: ORAL_TABLET | ORAL | 2 refills | Status: DC

## 2018-04-03 ENCOUNTER — Other Ambulatory Visit: Payer: Self-pay

## 2018-04-03 MED ORDER — METOPROLOL SUCCINATE ER 50 MG PO TB24: ORAL_TABLET | ORAL | 2 refills | Status: DC

## 2018-04-03 MED ORDER — APIXABAN 5 MG PO TABS: 5.0000 mg | ORAL_TABLET | Freq: Two times a day (BID) | ORAL | 3 refills | Status: AC

## 2018-04-03 NOTE — Telephone Encounter (Signed)
Refilled metoprolol and eliquis to [facility]

## 2018-04-03 NOTE — Telephone Encounter (Signed)
refilled metoprolol per fax request

## 2018-04-14 ENCOUNTER — Telehealth (INDEPENDENT_AMBULATORY_CARE_PROVIDER_SITE_OTHER): Payer: Medicare Other | Admitting: Cardiovascular Disease

## 2018-04-14 ENCOUNTER — Encounter: Payer: Self-pay | Admitting: Cardiovascular Disease

## 2018-04-14 VITALS — BP 115/87 | Ht 62.0 in | Wt 215.0 lb

## 2018-04-14 DIAGNOSIS — I350 Nonrheumatic aortic (valve) stenosis: Secondary | ICD-10-CM

## 2018-04-14 DIAGNOSIS — I5032 Chronic diastolic (congestive) heart failure: Secondary | ICD-10-CM

## 2018-04-14 DIAGNOSIS — I4821 Permanent atrial fibrillation: Secondary | ICD-10-CM

## 2018-04-14 DIAGNOSIS — I10 Essential (primary) hypertension: Secondary | ICD-10-CM

## 2018-04-14 NOTE — Progress Notes (Signed)
Virtual Visit via Video Note   This visit type was conducted due to national recommendations for restrictions regarding the COVID-19 Pandemic (e.g. social distancing) in an effort to limit this patient's exposure and mitigate transmission in our community.  Due to her co-morbid illnesses, this patient is at least at moderate risk for complications without adequate follow up.  This format is felt to be most appropriate for this patient at this time.  All issues noted in this document were discussed and addressed.  A limited physical exam was performed with this format.  Please refer to the patient's chart for her consent to telehealth for [facility].   Evaluation Performed:  Follow-up visit  Date:  04/14/2018   ID:  Christina, Clayton [birth year], MRN [id number]  Patient Location: Home  Provider Location: Office  PCP:  Octavio Graves, DO  Cardiologist:  Kate Sable, MD  Electrophysiologist:  None   Chief Complaint:  A fib, aortic stenosis  History of Present Illness:    Christina Clayton is a 73 y.o. female who presents via audio/video conferencing for a telehealth visit today.    Past medical history includes permanent atrial fibrillation, chronic diastolic heart failure, and aortic stenosis.  Echocardiogram in March 2020 showed normal left ventricular systolic function and moderate aortic stenosis.  She is doing very well.  She denies chest pain, palpitations, shortness of breath.  She denies leg swelling, orthopnea, and paroxysmal nocturnal dyspnea.  She lives with her daughter and son-in-law.  She told me that her daughter likes to plant flowers.  They have a big deck in the back and they are able to sit outdoors and enjoy it when the weather is nice.  The patient does not have symptoms concerning for COVID-19 infection (fever, chills, cough, or new shortness of breath).    Past Medical History:  Diagnosis Date  . Acid reflux   . Anemia   . Asthma   . Chronic atrial  fibrillation   . Chronic lung disease   . COPD (chronic obstructive pulmonary disease) ([place])   . Depression   . Emphysema (subcutaneous) (surgical) resulting from a procedure   . Hypertension   . Seasonal allergic rhinitis   . Skin cancer    Past Surgical History:  Procedure Laterality Date  . cataracts    . HYSTERECTOMY ABDOMINAL WITH SALPINGECTOMY       Current Meds  Medication Sig  . albuterol (PROVENTIL) (2.5 MG/3ML) 0.083% nebulizer solution Take 2.5 mg by nebulization every 6 (six) hours as needed for wheezing or shortness of breath.  . Albuterol Sulfate 108 (90 Base) MCG/ACT AEPB Inhale into the lungs.  Marland Kitchen amLODipine (NORVASC) 10 MG tablet Take 10 mg by mouth daily.  Marland Kitchen apixaban (ELIQUIS) 5 MG TABS tablet Take 1 tablet (5 mg total) by mouth 2 (two) times daily.  . Calcium Carb-Cholecalciferol (CALCIUM + D3) 600-200 MG-UNIT TABS Take by mouth.  . Fluticasone-Umeclidin-Vilant (TRELEGY ELLIPTA) 100-62.5-25 MCG/INH AEPB Inhale into the lungs.  . furosemide (LASIX) 40 MG tablet Take 40 mg by mouth.  . gabapentin (NEURONTIN) 600 MG tablet Take 600 mg by mouth 3 (three) times daily.  Marland Kitchen HYDROcodone-acetaminophen (NORCO/VICODIN) 5-325 MG tablet Take 1 tablet by mouth every 6 (six) hours as needed for moderate pain.  Marland Kitchen loratadine (CLARITIN) 10 MG tablet Take 10 mg by mouth daily.  Marland Kitchen losartan (COZAAR) 100 MG tablet Take 100 mg by mouth daily.  Marland Kitchen lovastatin (MEVACOR) 40 MG tablet Take 40 mg by mouth at  bedtime.  . Magnesium 250 MG TABS Take by mouth.  . metoprolol succinate (TOPROL-XL) 50 MG 24 hr tablet TAKE 1 TABLET BY MOUTH TWICE DAILY WITH  OR  IMMEDIATELY  FOLLOWING  A  MEAL  . montelukast (SINGULAIR) 10 MG tablet Take 10 mg by mouth at bedtime.  . Multiple Vitamins-Minerals (PX CENTURY VITAMIN PO) Take by mouth.  . Omega-3 300 MG CAPS Take by mouth.  . pantoprazole (PROTONIX) 40 MG tablet Take 40 mg by mouth daily.  . sertraline (ZOLOFT) 100 MG tablet Take 100 mg by mouth daily.      Allergies:   Lisinopril   Social History   Tobacco Use  . Smoking status: Former Research scientist (life sciences)  . Smokeless tobacco: Never Used  Substance Use Topics  . Alcohol use: Not on file  . Drug use: Not on file     Family Hx: The patient's family history includes Cancer in her brother.  ROS:   Please see the history of present illness.     All other systems reviewed and are negative.   Prior CV studies:   The following studies were reviewed today:  Echocardiogram 03/12/2018:   1. The left ventricle has normal systolic function with an ejection fraction of 60-65%. The cavity size was normal. Left ventricular diastolic Doppler parameters are indeterminate in the setting of atrial fibrillation. No evidence of left ventricular  regional wall motion abnormalities.  2. The right ventricle has normal systolic function. The cavity was normal. There is no increase in right ventricular wall thickness. Right ventricular systolic pressure normal with an estimated pressure of 9.7 mmHg.  3. Left atrial size was mildly dilated.  4. The aortic valve is probably tricuspid. Moderate calcification of the aortic valve. moderate stenosis of the aortic valve. LVOT/AV VTI 0.46 and AVA 1.2 cm2. Mild aortic annular calcification noted.  5. The mitral valve is grossly normal. There is moderate mitral annular calcification present.  6. The aortic root is normal in size and structure.   Labs/Other Tests and Data Reviewed:    EKG:  No ECG reviewed.  Recent Labs: No results found for requested labs within last 8760 hours.   Recent Lipid Panel No results found for: CHOL, TRIG, HDL, CHOLHDL, LDLCALC, LDLDIRECT  Wt Readings from Last 3 Encounters:  04/14/18 215 lb (97.5 kg)  10/08/17 216 lb 3.2 oz (98.1 kg)  04/01/17 211 lb (95.7 kg)     Objective:    Vital Signs:  BP 115/87   Ht 5\' 2"  (1.575 m)   Wt 215 lb (97.5 kg)   BMI 39.32 kg/m    Well nourished, well developed female in no acute distress.  Normal JVP. HEENT: [place]/at, eomi   ASSESSMENT & PLAN:    1. Permanent atrial fibrillation: She is currently asymptomatic from this standpoint.ContinueToprol-XL 50 mg twice daily for heart rate control. I willcontinuesystemic anticoagulation with apixaban 5 mg twice daily.  2. Chronicdiastolic heart failure: She is doing well on Lasix 40 mg daily.Continue Toprol-XL and losartan.  3. Hypertension: Blood pressure is normal. She is on amlodipine, Toprol-XL, and losartan.  No changes to therapy today.  4. Moderate aortic stenosis: Symptomatically stable. I will monitor with clinical and routine echocardiographic surveillance. Echo from March 2020 showed moderate aortic stenosis and normal LV systolic function.   COVID-19 Education: The signs and symptoms of COVID-19 were discussed with the patient and how to seek care for testing (follow up with PCP or arrange E-visit).  The importance of social distancing was  discussed today.  Time:   Today, I have spent 25 minutes with the patient with telehealth technology discussing the above problems.     Medication Adjustments/Labs and Tests Ordered: Current medicines are reviewed at length with the patient today.  Concerns regarding medicines are outlined above.  Tests Ordered: No orders of the defined types were placed in this encounter.  Medication Changes: No orders of the defined types were placed in this encounter.   Disposition:  Follow up in 6 month(s)  Signed, Kate Sable, MD  04/14/2018 11:15 AM    [place]

## 2018-04-14 NOTE — Patient Instructions (Signed)

## 2018-05-27 ENCOUNTER — Telehealth: Payer: Self-pay | Admitting: Cardiovascular Disease

## 2018-05-27 NOTE — Telephone Encounter (Signed)
For the last 2 weeks pt has been weak and dizzy when standing up. Denies SOB/chest pain/palpitaions/swelling. BP has been WNL - says she has a device at home that shows heart rhythm and suggest possible afib (pt will email those readings and will forward to provider) pt aware that those devices may not be accurate or reliable source of heart rhythm. Denies any medicine changes.

## 2018-05-27 NOTE — Telephone Encounter (Signed)
Patient's daughter is calling in regards to patient being in A-Fib/tg

## 2018-05-27 NOTE — Telephone Encounter (Signed)
Pt daughter voiced understanding - will check later today when she get home with pt

## 2018-05-27 NOTE — Telephone Encounter (Signed)
She has permanent atrial fibrillation so that is to be expected. Regarding symptoms of weakness/dizziness when standing, I'd like to her to check BP/HR while resting comfortably for 10 minutes and then to check same after standing (basically instruct her how to check orthostatics at home).

## 2018-05-28 MED ORDER — LOSARTAN POTASSIUM 50 MG PO TABS: 75.0000 mg | ORAL_TABLET | Freq: Every day | ORAL | 1 refills | Status: DC

## 2018-05-28 NOTE — Telephone Encounter (Signed)
Pt voiced understanding - updated medications

## 2018-05-28 NOTE — Telephone Encounter (Signed)
Pt says she just switched to taking amlodipine to the evening (says BP is lower in the mornings 111/62 this morning and has been feeling weak) previously noted of last night BP standing/resting was taken at 745 pm last night

## 2018-05-28 NOTE — Telephone Encounter (Signed)
Try reducing losartan to 75 mg q morning.

## 2018-05-28 NOTE — Telephone Encounter (Signed)
Pt takes amlodipine at nighttime, losartan in the morning, and metoprolol is bid once in the morning and once at night.

## 2018-05-28 NOTE — Telephone Encounter (Signed)
What time of day is she taking each of her BP meds?

## 2018-05-28 NOTE — Telephone Encounter (Signed)
Resting BP 5/26 at 745pm 108/62 HR 86 standing BP 158/96 HR 90

## 2018-05-28 NOTE — Telephone Encounter (Signed)
Since BP appears to be elevated after standing (daytime), have her take amlodipine in the morning along with losartan to see if this helps.

## 2018-06-16 DIAGNOSIS — I10 Essential (primary) hypertension: Secondary | ICD-10-CM | POA: Diagnosis not present

## 2018-06-16 DIAGNOSIS — J9611 Chronic respiratory failure with hypoxia: Secondary | ICD-10-CM | POA: Diagnosis not present

## 2018-06-16 DIAGNOSIS — J441 Chronic obstructive pulmonary disease with (acute) exacerbation: Secondary | ICD-10-CM | POA: Diagnosis not present

## 2018-06-16 DIAGNOSIS — I482 Chronic atrial fibrillation, unspecified: Secondary | ICD-10-CM | POA: Diagnosis not present

## 2018-07-08 DIAGNOSIS — I480 Paroxysmal atrial fibrillation: Secondary | ICD-10-CM | POA: Diagnosis not present

## 2018-07-08 DIAGNOSIS — K219 Gastro-esophageal reflux disease without esophagitis: Secondary | ICD-10-CM | POA: Diagnosis not present

## 2018-07-08 DIAGNOSIS — G8929 Other chronic pain: Secondary | ICD-10-CM | POA: Diagnosis not present

## 2018-07-08 DIAGNOSIS — J449 Chronic obstructive pulmonary disease, unspecified: Secondary | ICD-10-CM | POA: Diagnosis not present

## 2018-07-08 DIAGNOSIS — F329 Major depressive disorder, single episode, unspecified: Secondary | ICD-10-CM | POA: Diagnosis not present

## 2018-07-08 DIAGNOSIS — I10 Essential (primary) hypertension: Secondary | ICD-10-CM | POA: Diagnosis not present

## 2018-07-08 DIAGNOSIS — G2581 Restless legs syndrome: Secondary | ICD-10-CM | POA: Diagnosis not present

## 2018-09-10 IMAGING — US US ABDOMEN COMPLETE
1 series · 14 of 25 positions shown · non-contrast
Comparison: None.

CLINICAL DATA: Epigastric pain

EXAM:
ABDOMEN ULTRASOUND COMPLETE

[Series 1: us abdomen complete · 0.25mm/px · 14 of 99 slices shown]
[im 1/99]
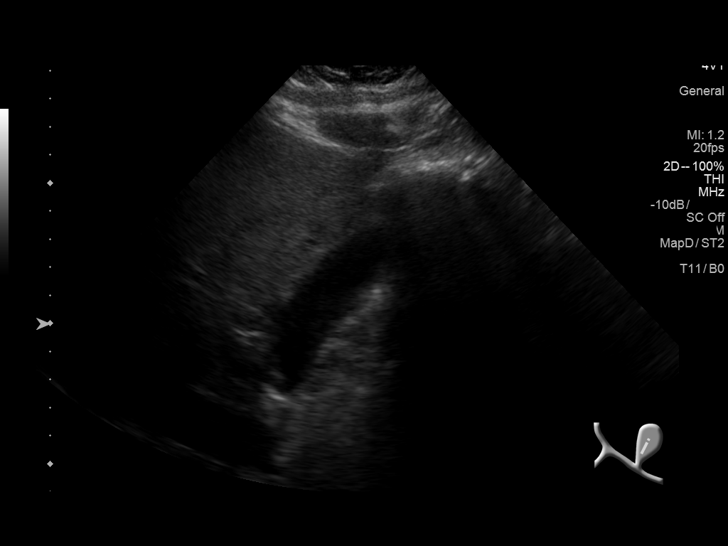
[im 9/99]
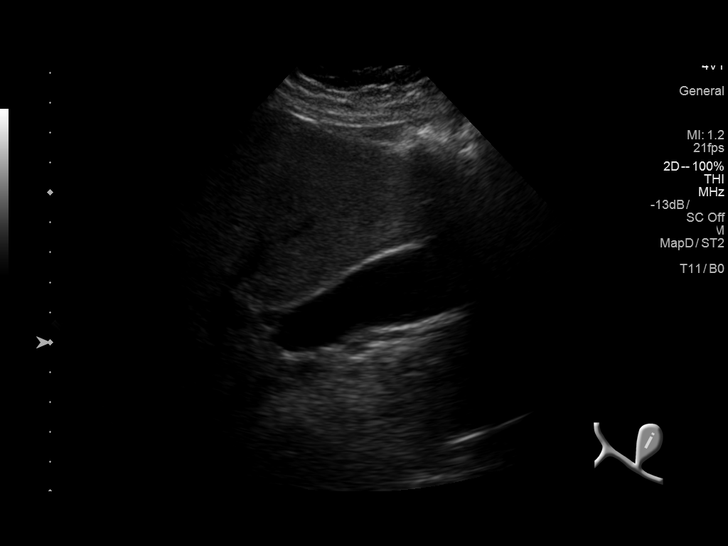
[im 17/99]
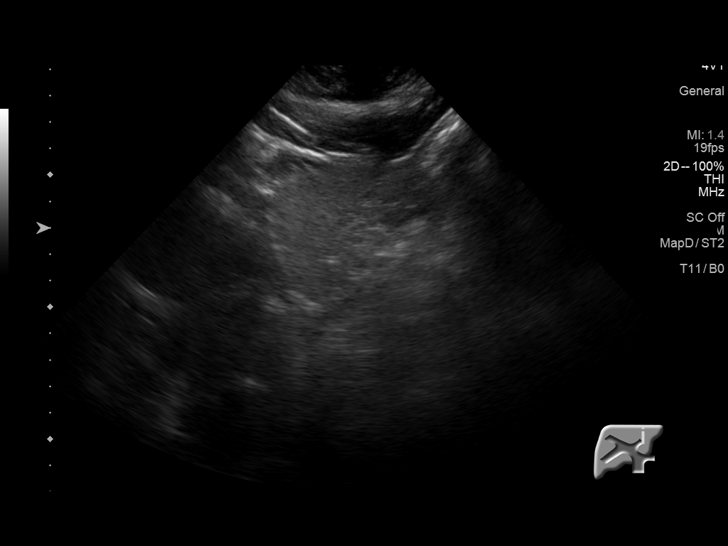
[im 25/99]
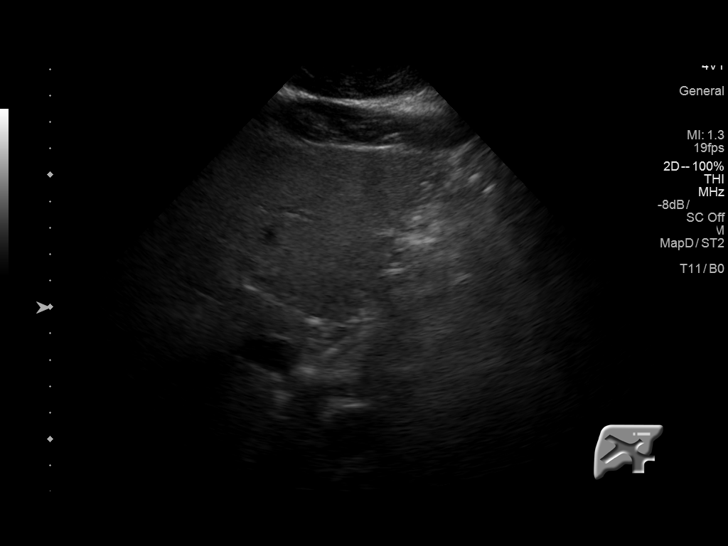
[im 33/99]
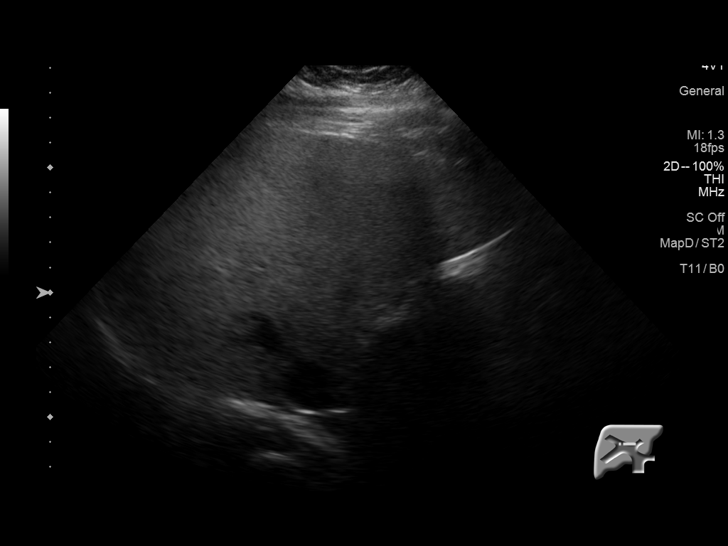
[im 37/99]
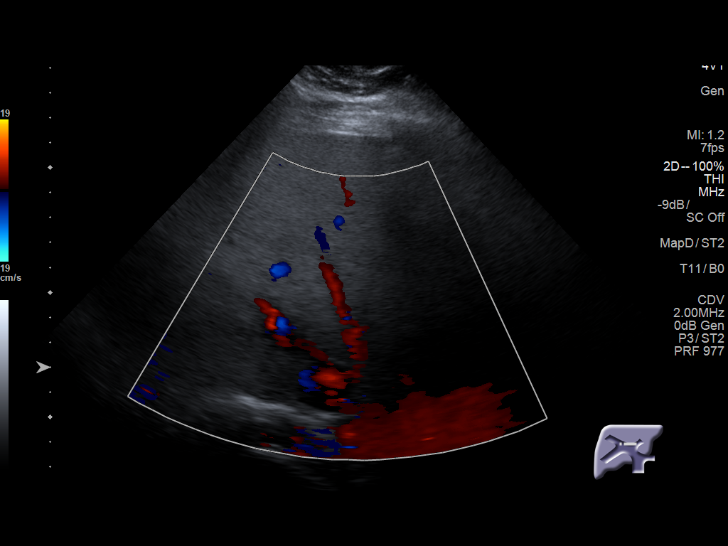
[im 45/99]
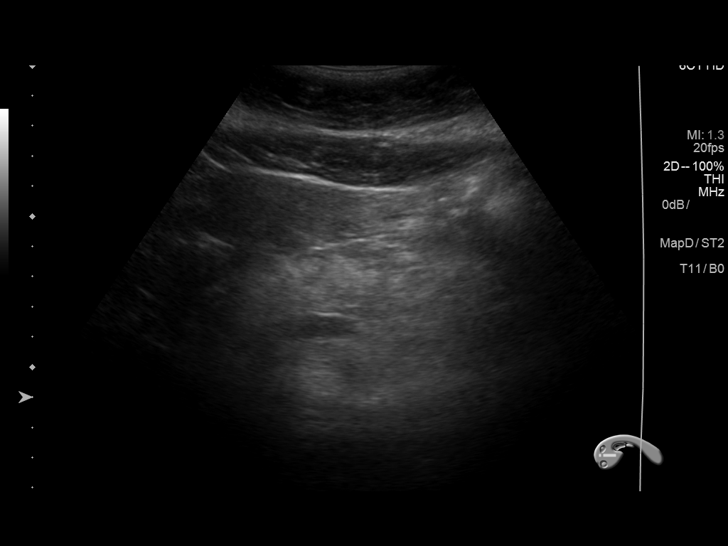
[im 54/99]
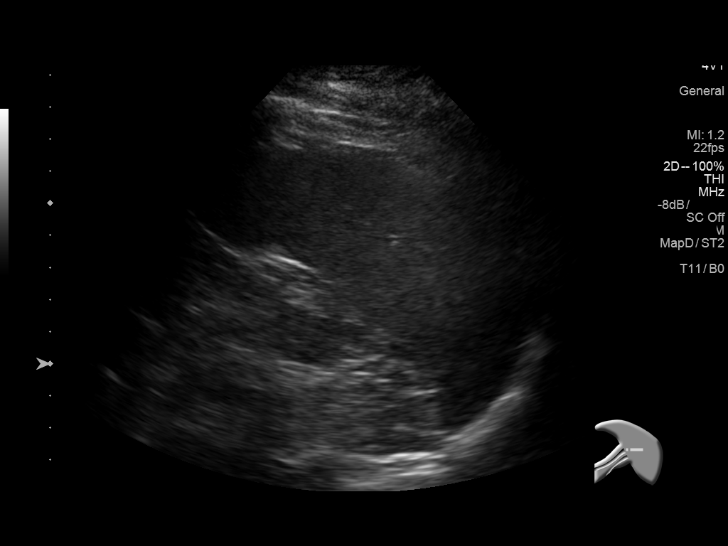
[im 62/99]
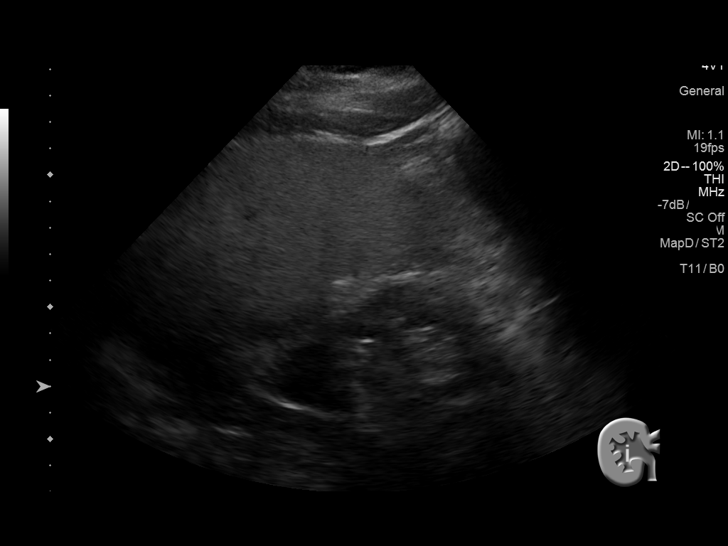
[im 66/99]
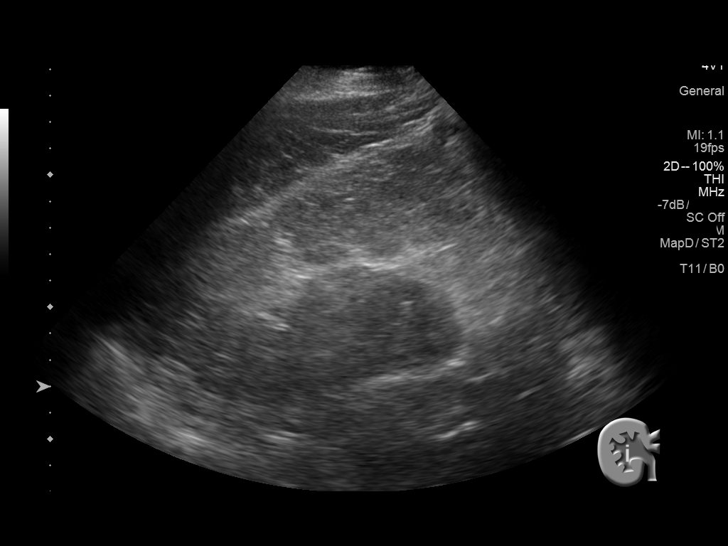
[im 74/99]
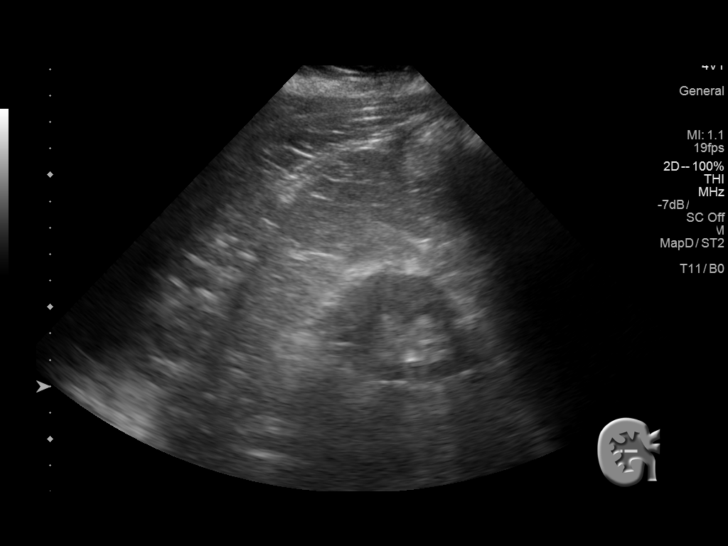
[im 82/99]
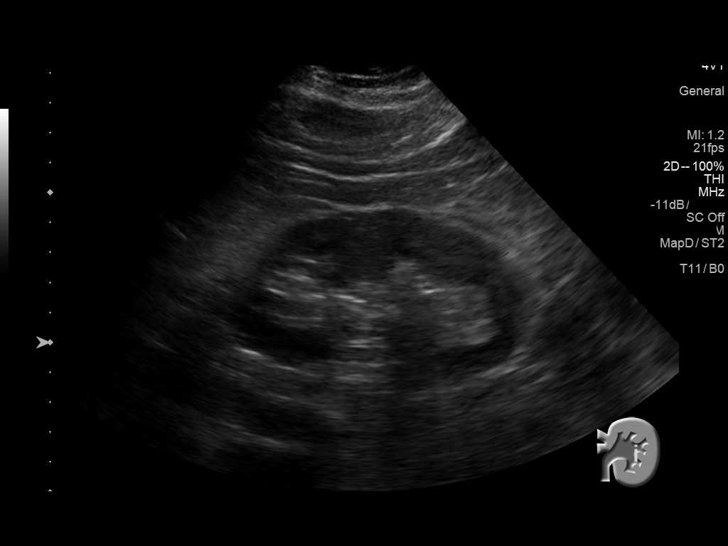
[im 90/99]
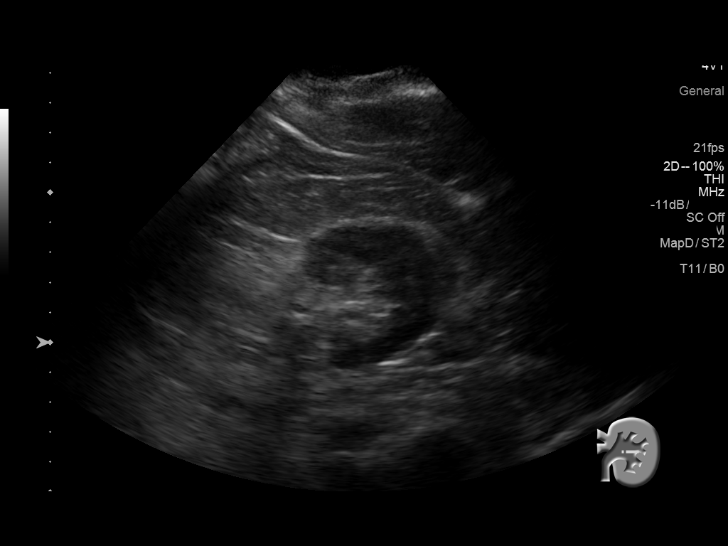
[im 99/99]
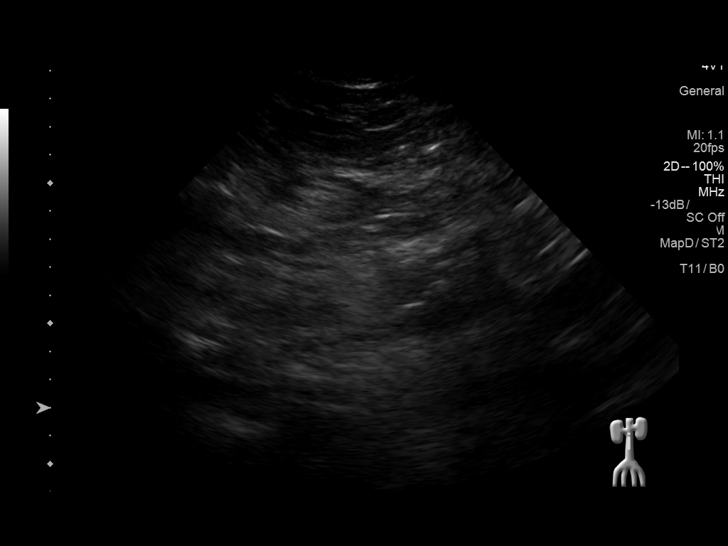

[14 of 25 positions shown; findings below may reference images not displayed]

FINDINGS: Gallbladder: No gallstones or wall thickening visualized. No
sonographic Murphy sign noted by sonographer.

Common bile duct: Diameter: 4 mm

Liver: Diffusely increased in echogenicity without focal mass.
Portal vein is patent on color Doppler imaging with normal direction
of blood flow towards the liver.

IVC: No abnormality visualized.

Pancreas: Visualized portion unremarkable.

Spleen: Size and appearance within normal limits.

Right Kidney: Length: 11.3 cm. Echogenicity within normal limits. No
mass or hydronephrosis visualized.

Left Kidney: Length: 11.0 cm. Echogenicity within normal limits. No
mass or hydronephrosis visualized.

Abdominal aorta: No aneurysm visualized.

Other findings: None.
IMPRESSION: No acute intra-abdominal pathology.

## 2018-09-11 DIAGNOSIS — Z23 Encounter for immunization: Secondary | ICD-10-CM | POA: Diagnosis not present

## 2018-09-11 DIAGNOSIS — J301 Allergic rhinitis due to pollen: Secondary | ICD-10-CM | POA: Diagnosis not present

## 2018-09-11 DIAGNOSIS — I5032 Chronic diastolic (congestive) heart failure: Secondary | ICD-10-CM | POA: Diagnosis not present

## 2018-09-11 DIAGNOSIS — J449 Chronic obstructive pulmonary disease, unspecified: Secondary | ICD-10-CM | POA: Diagnosis not present

## 2018-09-11 DIAGNOSIS — I482 Chronic atrial fibrillation, unspecified: Secondary | ICD-10-CM | POA: Diagnosis not present

## 2018-12-03 ENCOUNTER — Encounter: Payer: Self-pay | Admitting: Cardiovascular Disease

## 2018-12-03 ENCOUNTER — Telehealth (INDEPENDENT_AMBULATORY_CARE_PROVIDER_SITE_OTHER): Payer: Medicare Other | Admitting: Cardiovascular Disease

## 2018-12-03 ENCOUNTER — Encounter: Payer: Self-pay | Admitting: *Deleted

## 2018-12-03 VITALS — BP 118/65 | HR 84 | Ht 62.0 in | Wt 216.0 lb

## 2018-12-03 DIAGNOSIS — I10 Essential (primary) hypertension: Secondary | ICD-10-CM

## 2018-12-03 DIAGNOSIS — I350 Nonrheumatic aortic (valve) stenosis: Secondary | ICD-10-CM

## 2018-12-03 DIAGNOSIS — Z7901 Long term (current) use of anticoagulants: Secondary | ICD-10-CM

## 2018-12-03 DIAGNOSIS — I4821 Permanent atrial fibrillation: Secondary | ICD-10-CM

## 2018-12-03 DIAGNOSIS — I5032 Chronic diastolic (congestive) heart failure: Secondary | ICD-10-CM

## 2018-12-03 NOTE — Patient Instructions (Addendum)

## 2018-12-03 NOTE — Progress Notes (Signed)
Virtual Visit via Telephone Note   This visit type was conducted due to national recommendations for restrictions regarding the COVID-19 Pandemic (e.g. social distancing) in an effort to limit this patient's exposure and mitigate transmission in our community.  Due to her co-morbid illnesses, this patient is at least at moderate risk for complications without adequate follow up.  This format is felt to be most appropriate for this patient at this time.  The patient did not have access to video technology/had technical difficulties with video requiring transitioning to audio format only (telephone).  All issues noted in this document were discussed and addressed.  No physical exam could be performed with this format.  Please refer to the patient's chart for her  consent to telehealth for [facility].   Date:  12/03/2018   ID:  Christina Clayton, Christina Clayton 1945-10-13, MRN [id number]  Patient Location: Home Provider Location: Office  PCP:  Scotty Court, DO  Cardiologist:  Kate Sable, MD  Electrophysiologist:  None   Evaluation Performed:  Follow-Up Visit  Chief Complaint: Atrial fibrillation, aortic stenosis  History of Present Illness:    Christina Clayton is a 73 y.o. female with permanent atrial fibrillation, chronic diastolic heart failure, and aortic stenosis.  Echocardiogram in March 2020 showed normal left ventricular systolic function and moderate aortic stenosis.  The patient denies any symptoms of chest pain, palpitations, shortness of breath, lightheadedness, dizziness, orthopnea, PND, and syncope.  She's had more leg swelling in the past few days and doubled up on Lasix yesterday and today. She has been eating salty foods.  She lives with her daughter and son-in-law.   She likes to crochet.  The patient does not have symptoms concerning for COVID-19 infection (fever, chills, cough, or new shortness of breath).    Past Medical History:  Diagnosis Date   Acid reflux     Anemia    Asthma    Chronic atrial fibrillation ([facility])    Chronic lung disease    COPD (chronic obstructive pulmonary disease) ([facility])    Depression    Emphysema (subcutaneous) (surgical) resulting from a procedure    Hypertension    Seasonal allergic rhinitis    Skin cancer    Past Surgical History:  Procedure Laterality Date   cataracts     HYSTERECTOMY ABDOMINAL WITH SALPINGECTOMY       Current Meds  Medication Sig   albuterol (PROVENTIL) (2.5 MG/3ML) 0.083% nebulizer solution Take 2.5 mg by nebulization every 6 (six) hours as needed for wheezing or shortness of breath.   Albuterol Sulfate 108 (90 Base) MCG/ACT AEPB Inhale into the lungs.   amLODipine (NORVASC) 10 MG tablet Take 10 mg by mouth daily.   apixaban (ELIQUIS) 5 MG TABS tablet Take 1 tablet (5 mg total) by mouth 2 (two) times daily.   Calcium Carb-Cholecalciferol (CALCIUM + D3) 600-200 MG-UNIT TABS Take 1 tablet by mouth daily.    cetirizine (ZYRTEC) 10 MG tablet Take 10 mg by mouth daily.   Fluticasone-Salmeterol (WIXELA INHUB) 250-50 MCG/DOSE AEPB Inhale 1 puff into the lungs 2 (two) times daily.   furosemide (LASIX) 40 MG tablet Take 40 mg by mouth.   losartan (COZAAR) 100 MG tablet Take 100 mg by mouth daily.   lovastatin (MEVACOR) 40 MG tablet Take 40 mg by mouth at bedtime.   magnesium oxide (MAG-OX) 400 MG tablet Take 400 mg by mouth daily.   metoprolol succinate (TOPROL-XL) 50 MG 24 hr tablet TAKE 1 TABLET BY MOUTH TWICE  DAILY WITH  OR  IMMEDIATELY  FOLLOWING  A  MEAL   montelukast (SINGULAIR) 10 MG tablet Take 10 mg by mouth at bedtime.   Multiple Vitamins-Minerals (PX CENTURY VITAMIN PO) Take 1 tablet by mouth daily.    Omega-3 300 MG CAPS Take 1 capsule by mouth daily.    pantoprazole (PROTONIX) 40 MG tablet Take 40 mg by mouth daily.   sertraline (ZOLOFT) 100 MG tablet Take 100 mg by mouth daily.   tiotropium (SPIRIVA) 18 MCG inhalation capsule Place 18 mcg into inhaler and  inhale daily.   [DISCONTINUED] losartan (COZAAR) 50 MG tablet Take 1.5 tablets (75 mg total) by mouth daily. IN THE MORNING (Patient taking differently: Take 50 mg by mouth daily. IN THE MORNING)     Allergies:   Lisinopril   Social History   Tobacco Use   Smoking status: Former Smoker   Smokeless tobacco: Never Used  Substance Use Topics   Alcohol use: Not on file   Drug use: Not on file     Family Hx: The patient's family history includes Cancer in her brother.  ROS:   Please see the history of present illness.     All other systems reviewed and are negative.   Prior CV studies:   The following studies were reviewed today:  Echocardiogram 03/12/2018:  1. The left ventricle has normal systolic function with an ejection fraction of 60-65%. The cavity size was normal. Left ventricular diastolic Doppler parameters are indeterminate in the setting of atrial fibrillation. No evidence of left ventricular  regional wall motion abnormalities. 2. The right ventricle has normal systolic function. The cavity was normal. There is no increase in right ventricular wall thickness. Right ventricular systolic pressure normal with an estimated pressure of 9.7 mmHg. 3. Left atrial size was mildly dilated. 4. The aortic valve is probably tricuspid. Moderate calcification of the aortic valve. moderate stenosis of the aortic valve. LVOT/AV VTI 0.46 and AVA 1.2 cm2. Mild aortic annular calcification noted. 5. The mitral valve is grossly normal. There is moderate mitral annular calcification present. 6. The aortic root is normal in size and structure.  Labs/Other Tests and Data Reviewed:    EKG:  No ECG reviewed.  Recent Labs: No results found for requested labs within last 8760 hours.   Recent Lipid Panel No results found for: CHOL, TRIG, HDL, CHOLHDL, LDLCALC, LDLDIRECT  Wt Readings from Last 3 Encounters:  12/03/18 216 lb (98 kg)  04/14/18 215 lb (97.5 kg)  10/08/17 216 lb  3.2 oz (98.1 kg)     Objective:    Vital Signs:  BP 118/65    Pulse 84    Ht 5\' 2"  (1.575 m)    Wt 216 lb (98 kg)    SpO2 95%    BMI 39.51 kg/m    VITAL SIGNS:  reviewed  ASSESSMENT & PLAN:    1. Permanent atrial fibrillation: She is currently asymptomatic from thisstandpoint.ContinueToprol-XL 50 mg twice daily for heart rate control. I willcontinuesystemic anticoagulation with apixaban 5 mg twice daily.  2. Chronicdiastolic heart failure:  She has been eating more salty foods and has had consequent leg swelling over the past 2 days and doubled up on Lasix. We talked about the importance of a low-sodium diet. Continue Toprol-XL and losartan.  3. Hypertension: Blood pressure is normal. She is on amlodipine, Toprol-XL,and losartan. No changes to therapy today.  4. Moderate aortic stenosis: Symptomatically stable. I will monitor with clinical and routine echocardiographic surveillance. Echo from  March 2020 showed moderate aortic stenosis and normal LV systolic function.    COVID-19 Education: The signs and symptoms of COVID-19 were discussed with the patient and how to seek care for testing (follow up with PCP or arrange E-visit).  The importance of social distancing was discussed today.  Time:   Today, I have spent 10 minutes with the patient with telehealth technology discussing the above problems.     Medication Adjustments/Labs and Tests Ordered: Current medicines are reviewed at length with the patient today.  Concerns regarding medicines are outlined above.   Tests Ordered: No orders of the defined types were placed in this encounter.   Medication Changes: No orders of the defined types were placed in this encounter.   Follow Up:  Virtual Visit  in 6 month(s)  Signed, Kate Sable, MD  12/03/2018 1:10 PM    [facility]

## 2018-12-11 DIAGNOSIS — I482 Chronic atrial fibrillation, unspecified: Secondary | ICD-10-CM | POA: Diagnosis not present

## 2018-12-11 DIAGNOSIS — I5032 Chronic diastolic (congestive) heart failure: Secondary | ICD-10-CM | POA: Diagnosis not present

## 2018-12-11 DIAGNOSIS — E668 Other obesity: Secondary | ICD-10-CM | POA: Diagnosis not present

## 2018-12-11 DIAGNOSIS — J449 Chronic obstructive pulmonary disease, unspecified: Secondary | ICD-10-CM | POA: Diagnosis not present

## 2018-12-30 ENCOUNTER — Other Ambulatory Visit: Payer: Self-pay | Admitting: Cardiovascular Disease

## 2019-01-12 DIAGNOSIS — M130 Polyarthritis, unspecified: Secondary | ICD-10-CM | POA: Diagnosis not present

## 2019-01-12 DIAGNOSIS — J441 Chronic obstructive pulmonary disease with (acute) exacerbation: Secondary | ICD-10-CM | POA: Diagnosis not present

## 2019-01-12 DIAGNOSIS — Z79899 Other long term (current) drug therapy: Secondary | ICD-10-CM | POA: Diagnosis not present

## 2019-01-12 DIAGNOSIS — J449 Chronic obstructive pulmonary disease, unspecified: Secondary | ICD-10-CM | POA: Diagnosis not present

## 2019-01-16 DIAGNOSIS — Z23 Encounter for immunization: Secondary | ICD-10-CM | POA: Diagnosis not present

## 2019-01-26 DIAGNOSIS — U071 COVID-19: Secondary | ICD-10-CM | POA: Diagnosis not present

## 2019-01-26 DIAGNOSIS — R531 Weakness: Secondary | ICD-10-CM | POA: Diagnosis not present

## 2019-01-26 DIAGNOSIS — R0902 Hypoxemia: Secondary | ICD-10-CM | POA: Diagnosis not present

## 2019-01-27 DIAGNOSIS — Z515 Encounter for palliative care: Secondary | ICD-10-CM | POA: Diagnosis not present

## 2019-01-27 DIAGNOSIS — J1282 Pneumonia due to coronavirus disease 2019: Secondary | ICD-10-CM | POA: Diagnosis present

## 2019-01-27 DIAGNOSIS — I700 Atherosclerosis of aorta: Secondary | ICD-10-CM | POA: Diagnosis not present

## 2019-01-27 DIAGNOSIS — I4891 Unspecified atrial fibrillation: Secondary | ICD-10-CM | POA: Diagnosis present

## 2019-01-27 DIAGNOSIS — R404 Transient alteration of awareness: Secondary | ICD-10-CM | POA: Diagnosis not present

## 2019-01-27 DIAGNOSIS — R042 Hemoptysis: Secondary | ICD-10-CM | POA: Diagnosis not present

## 2019-01-27 DIAGNOSIS — U071 COVID-19: Secondary | ICD-10-CM | POA: Diagnosis present

## 2019-01-27 DIAGNOSIS — R918 Other nonspecific abnormal finding of lung field: Secondary | ICD-10-CM | POA: Diagnosis not present

## 2019-01-27 DIAGNOSIS — K219 Gastro-esophageal reflux disease without esophagitis: Secondary | ICD-10-CM | POA: Diagnosis present

## 2019-01-27 DIAGNOSIS — Z7901 Long term (current) use of anticoagulants: Secondary | ICD-10-CM | POA: Diagnosis not present

## 2019-01-27 DIAGNOSIS — Z66 Do not resuscitate: Secondary | ICD-10-CM | POA: Diagnosis not present

## 2019-01-27 DIAGNOSIS — J9602 Acute respiratory failure with hypercapnia: Secondary | ICD-10-CM | POA: Diagnosis not present

## 2019-01-27 DIAGNOSIS — Z4682 Encounter for fitting and adjustment of non-vascular catheter: Secondary | ICD-10-CM | POA: Diagnosis not present

## 2019-01-27 DIAGNOSIS — R58 Hemorrhage, not elsewhere classified: Secondary | ICD-10-CM | POA: Diagnosis not present

## 2019-01-27 DIAGNOSIS — J9690 Respiratory failure, unspecified, unspecified whether with hypoxia or hypercapnia: Secondary | ICD-10-CM | POA: Diagnosis not present

## 2019-01-27 DIAGNOSIS — I10 Essential (primary) hypertension: Secondary | ICD-10-CM | POA: Diagnosis present

## 2019-01-27 DIAGNOSIS — F0390 Unspecified dementia without behavioral disturbance: Secondary | ICD-10-CM | POA: Diagnosis present

## 2019-01-27 DIAGNOSIS — G894 Chronic pain syndrome: Secondary | ICD-10-CM | POA: Diagnosis present

## 2019-01-27 DIAGNOSIS — J189 Pneumonia, unspecified organism: Secondary | ICD-10-CM | POA: Diagnosis not present

## 2019-01-27 DIAGNOSIS — Z4689 Encounter for fitting and adjustment of other specified devices: Secondary | ICD-10-CM | POA: Diagnosis not present

## 2019-01-27 DIAGNOSIS — R410 Disorientation, unspecified: Secondary | ICD-10-CM | POA: Diagnosis not present

## 2019-01-27 DIAGNOSIS — R0902 Hypoxemia: Secondary | ICD-10-CM | POA: Diagnosis not present

## 2019-01-27 DIAGNOSIS — E871 Hypo-osmolality and hyponatremia: Secondary | ICD-10-CM | POA: Diagnosis not present

## 2019-01-27 DIAGNOSIS — J9601 Acute respiratory failure with hypoxia: Secondary | ICD-10-CM | POA: Diagnosis present

## 2019-03-02 DEATH — deceased

## 2019-03-13 ENCOUNTER — Telehealth: Payer: Self-pay

## 2019-03-13 DIAGNOSIS — I350 Nonrheumatic aortic (valve) stenosis: Secondary | ICD-10-CM

## 2019-03-13 NOTE — Telephone Encounter (Signed)
Order placed for yrly echo, will forward to front desk to schedule

## 2019-03-30 ENCOUNTER — Other Ambulatory Visit (HOSPITAL_COMMUNITY): Payer: Medicare Other

## 2019-06-24 ENCOUNTER — Telehealth: Payer: Medicare Other | Admitting: Cardiovascular Disease

## 2019-06-25 ENCOUNTER — Telehealth: Payer: Medicare Other | Admitting: Cardiovascular Disease
